# Patient Record
Sex: Female | Born: 1945 | Race: White | Hispanic: No | Marital: Married | Smoking: Never smoker
Health system: Southern US, Community
[De-identification: ages and names within clinical notes are randomized; demographics above are authoritative.]

## PROBLEM LIST (undated history)

## (undated) DIAGNOSIS — F419 Anxiety disorder, unspecified: Secondary | ICD-10-CM

## (undated) DIAGNOSIS — E785 Hyperlipidemia, unspecified: Secondary | ICD-10-CM

## (undated) DIAGNOSIS — F32A Depression, unspecified: Secondary | ICD-10-CM

## (undated) DIAGNOSIS — K219 Gastro-esophageal reflux disease without esophagitis: Secondary | ICD-10-CM

## (undated) DIAGNOSIS — I10 Essential (primary) hypertension: Secondary | ICD-10-CM

---

## 1981-10-24 HISTORY — PX: TUBAL LIGATION: SHX77

## 1983-10-25 HISTORY — PX: DILATION AND CURETTAGE OF UTERUS: SHX78

## 1988-10-24 HISTORY — PX: NASAL SINUS SURGERY: SHX719

## 2010-01-25 ENCOUNTER — Encounter: Admission: RE | Admit: 2010-01-25 | Discharge: 2010-01-25 | Payer: Self-pay | Admitting: Surgery

## 2010-09-27 ENCOUNTER — Encounter: Admission: RE | Admit: 2010-09-27 | Discharge: 2010-09-27 | Payer: Self-pay | Admitting: Surgery

## 2011-03-29 IMAGING — US US SOFT TISSUE HEAD/NECK
1 series · 14 of 25 positions shown · non-contrast
Comparison: None.

CLINICAL DATA: Thyroid nodules.

THYROID ULTRASOUND
TECHNIQUE: Ultrasound examination of the thyroid gland and
adjacent soft tissues was performed.

[Series 1: us soft tissue head/neck · 0.04mm/px · 14 of 46 slices shown]
[im 1/46]
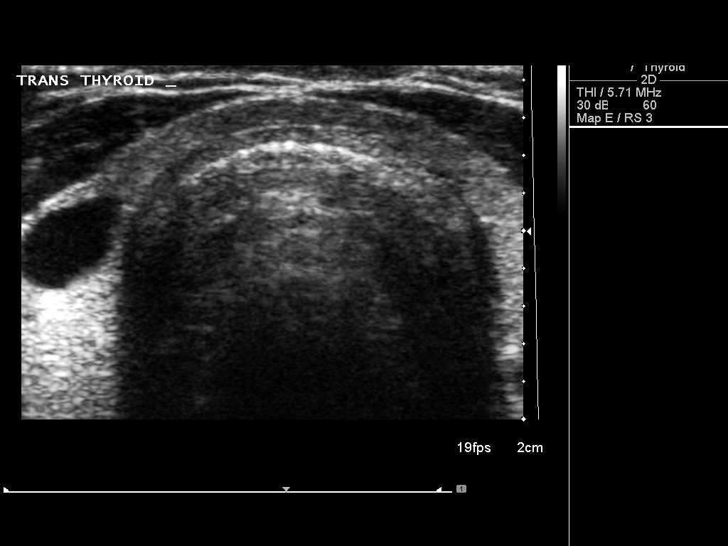
[im 4/46]
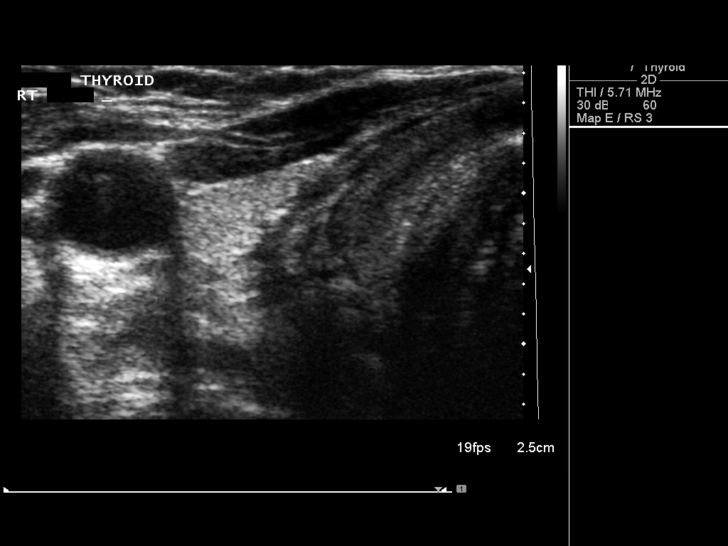
[im 8/46]
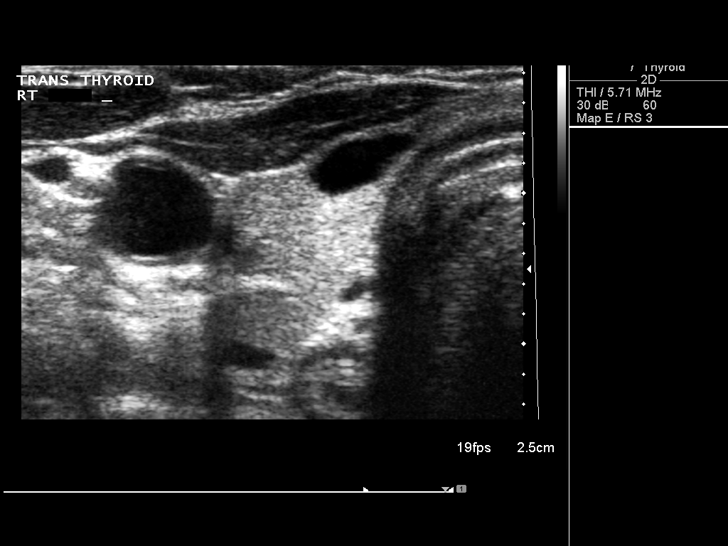
[im 12/46]
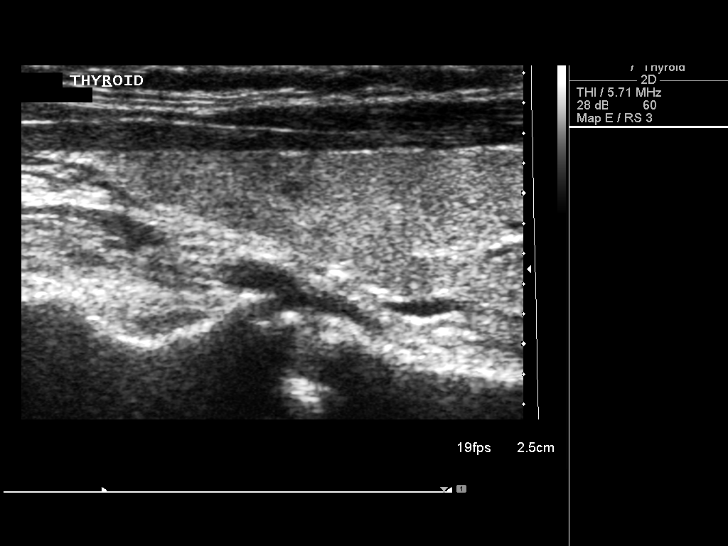
[im 16/46]
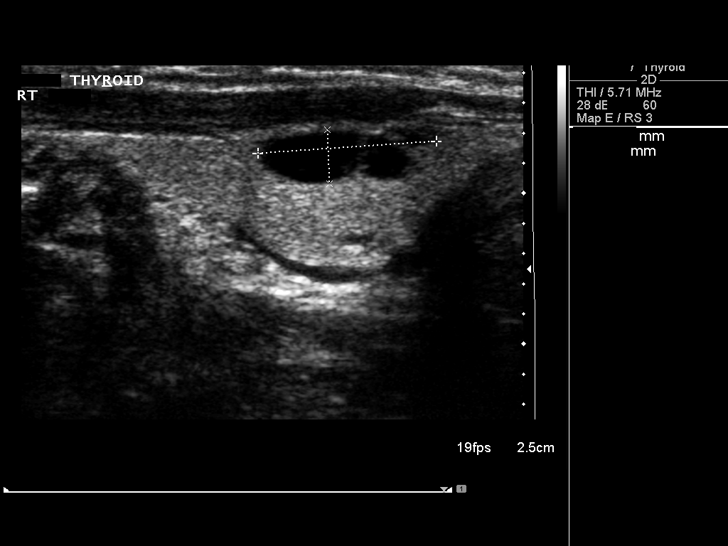
[im 17/46]
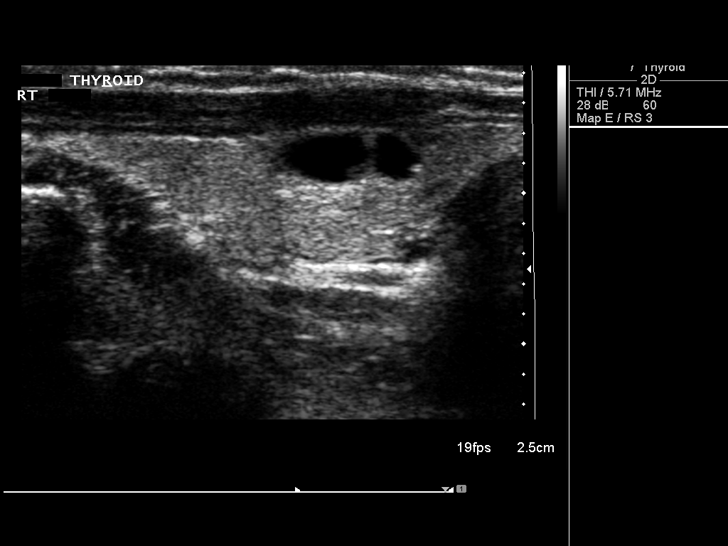
[im 21/46]
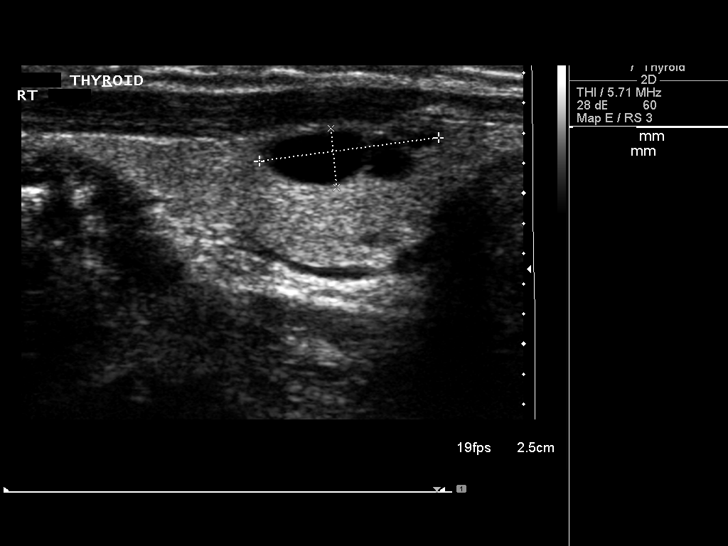
[im 25/46]
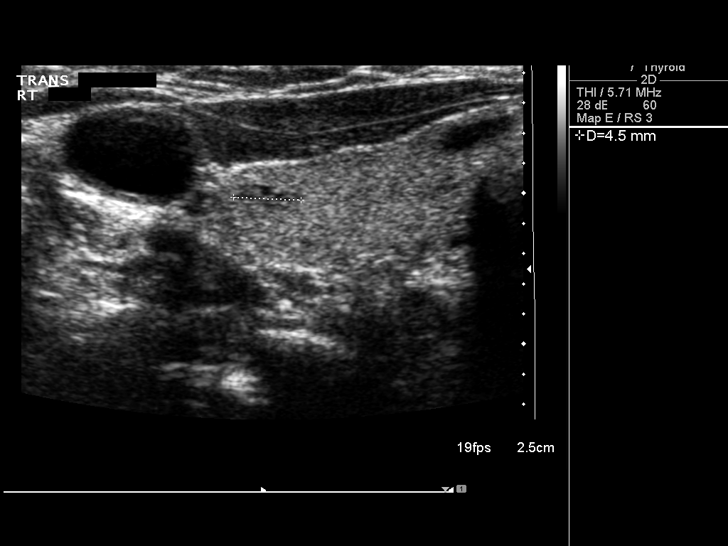
[im 29/46]
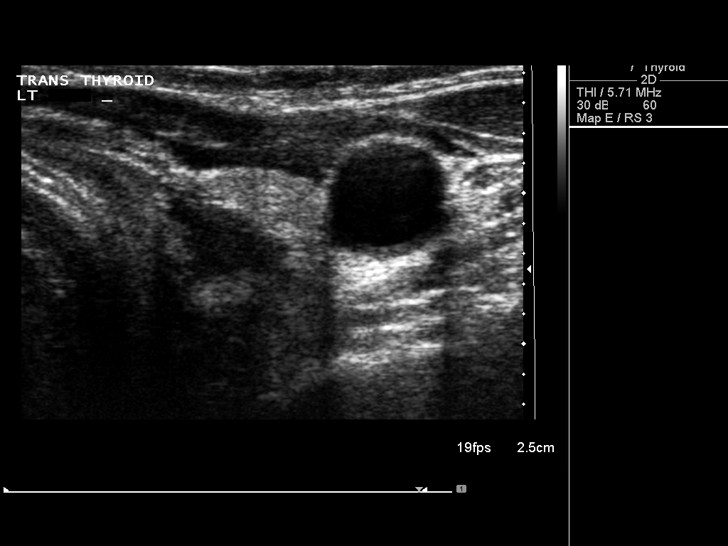
[im 31/46]
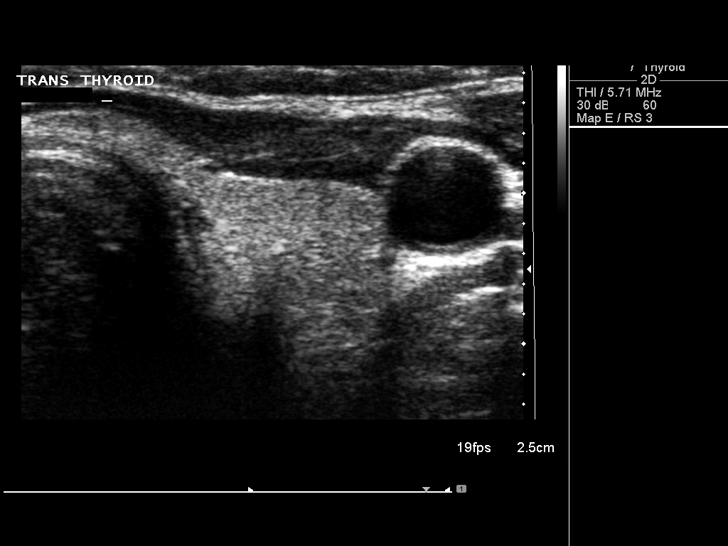
[im 34/46]
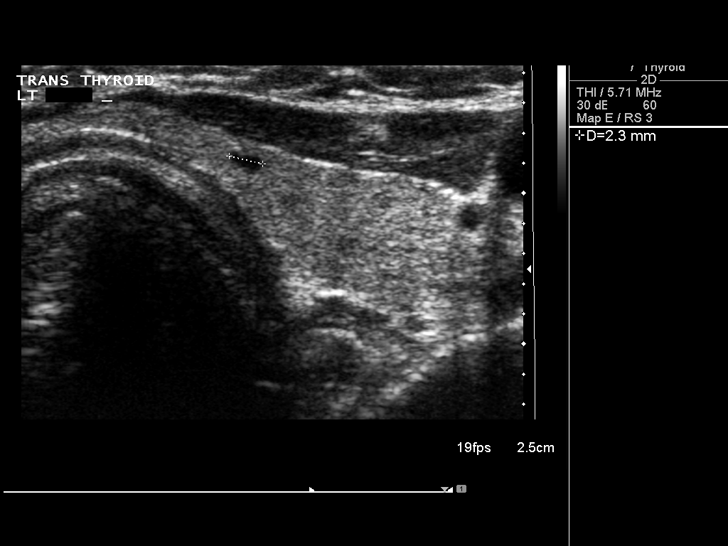
[im 38/46]
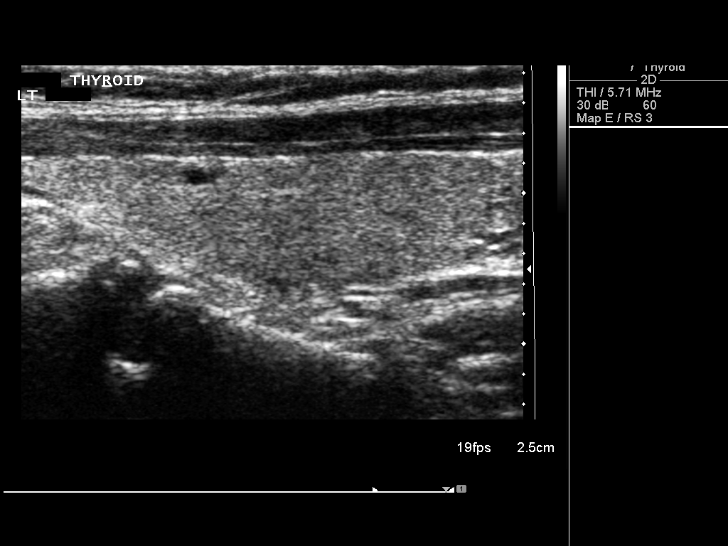
[im 42/46]
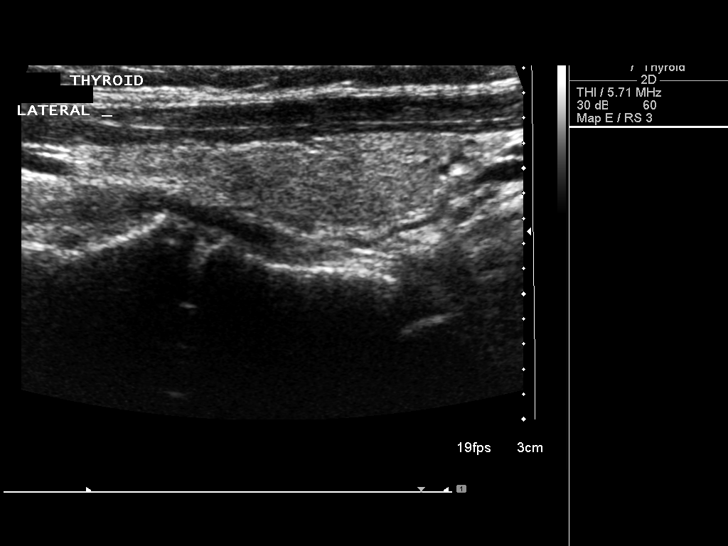
[im 46/46]
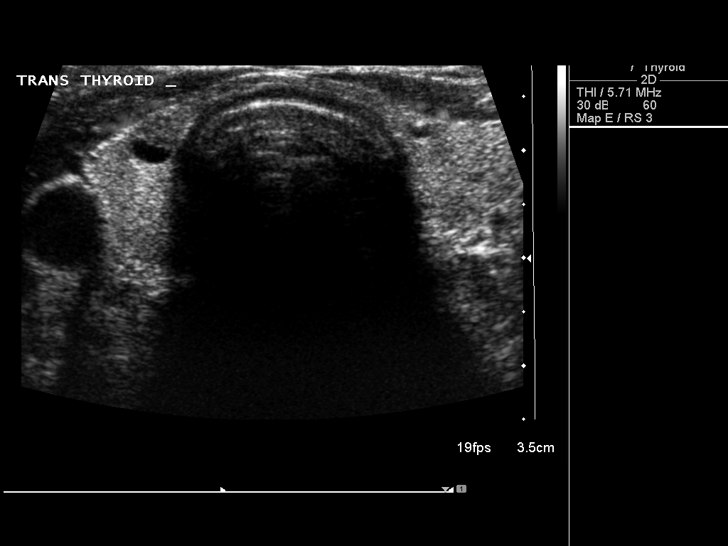

[14 of 25 positions shown; findings below may reference images not displayed]

FINDINGS: Right lobe of the thyroid measures 4.0 x 1.2 x 1.2 cm and
left lobe, 4.4 x 1.0 x 1.3 cm.  Isthmus measures 2 mm.  Thyroid
echotexture is homogeneous.  A bilobed cyst or a single cyst with
thin internal septation is seen in the medial right lower pole,
measuring 1.2 x 0.4 x 0.8 cm.  A solid nodule in the upper pole of
the right thyroid measures 4 mm.  There are scattered hypoechoic
nodules in the left thyroid, which measure 2 mm or less in size.
IMPRESSION: Bilobed or mildly complex right lower pole cyst.

## 2011-10-25 HISTORY — PX: POSTERIOR LUMBAR FUSION 4 LEVEL: SHX6037

## 2011-11-29 IMAGING — US US SOFT TISSUE HEAD/NECK
1 series · 14 of 25 positions shown · non-contrast
Comparison: 01/25/2010

CLINICAL DATA: Follow-up thyroid nodules

THYROID ULTRASOUND
TECHNIQUE: Ultrasound examination of the thyroid gland and adjacent
soft tissues was performed.

[Series 1: us soft tissue head/neck · 0.08mm/px · 14 of 60 slices shown]
[im 1/60]
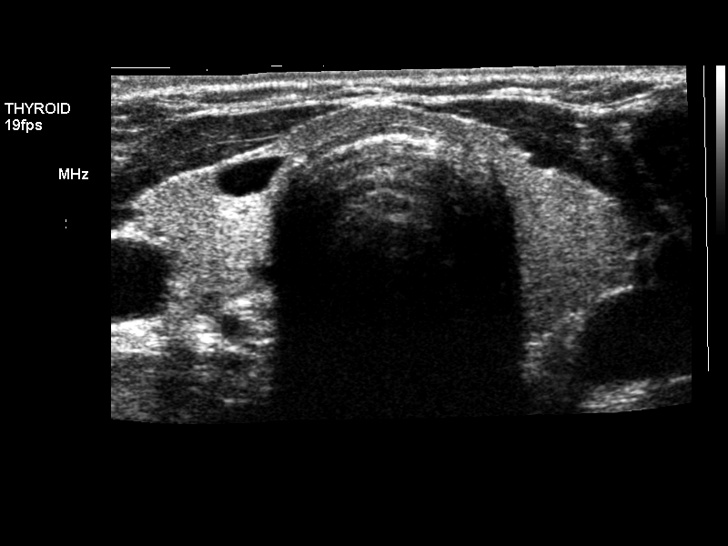
[im 5/60]
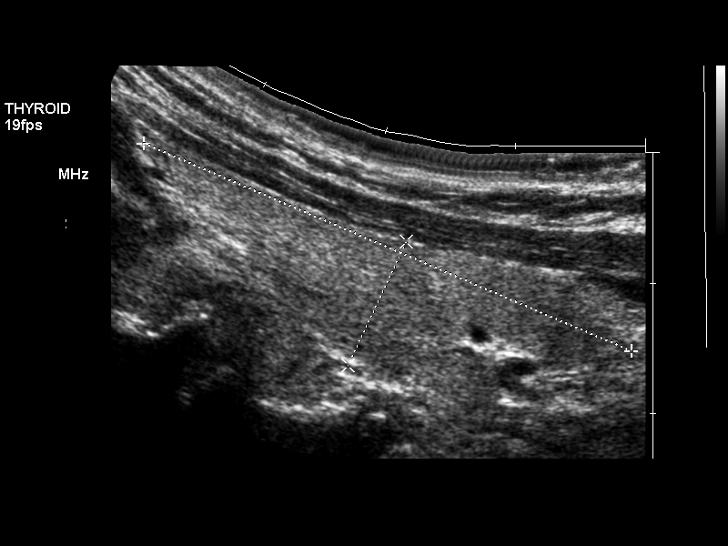
[im 10/60]
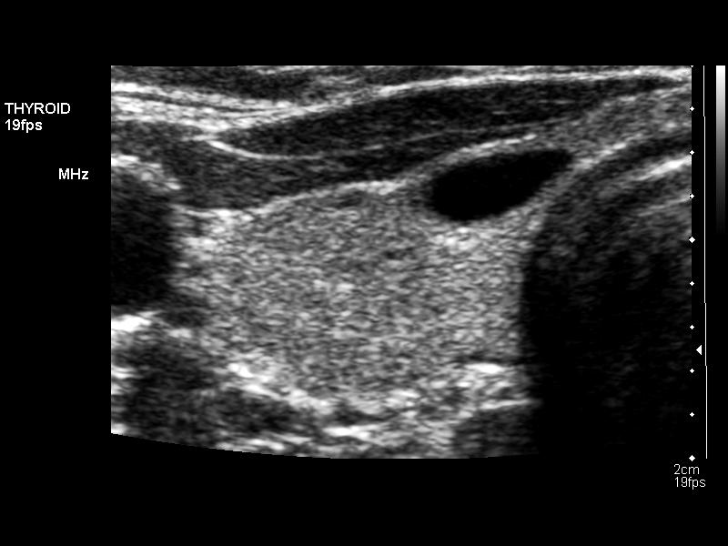
[im 15/60]
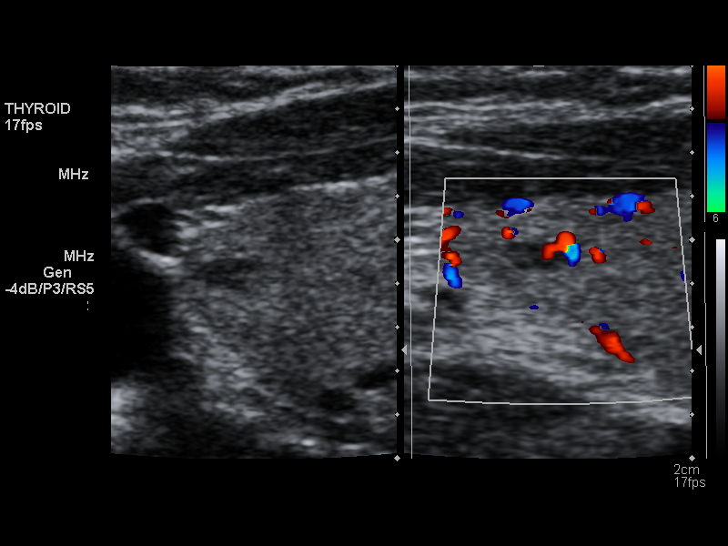
[im 20/60]
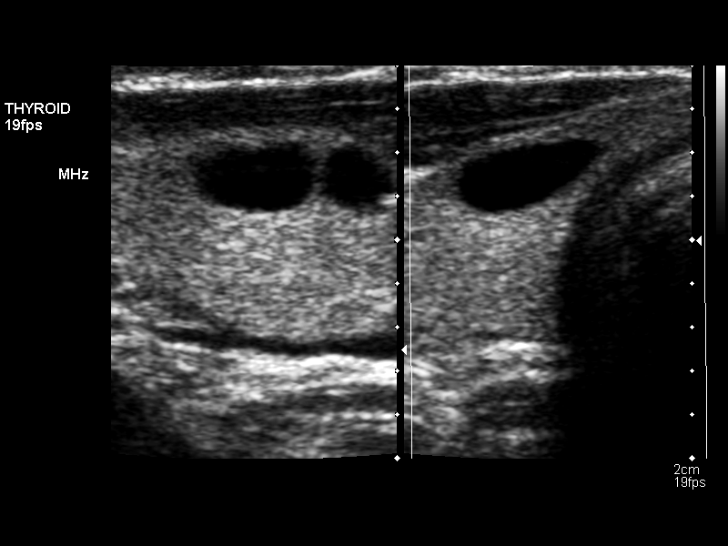
[im 23/60]
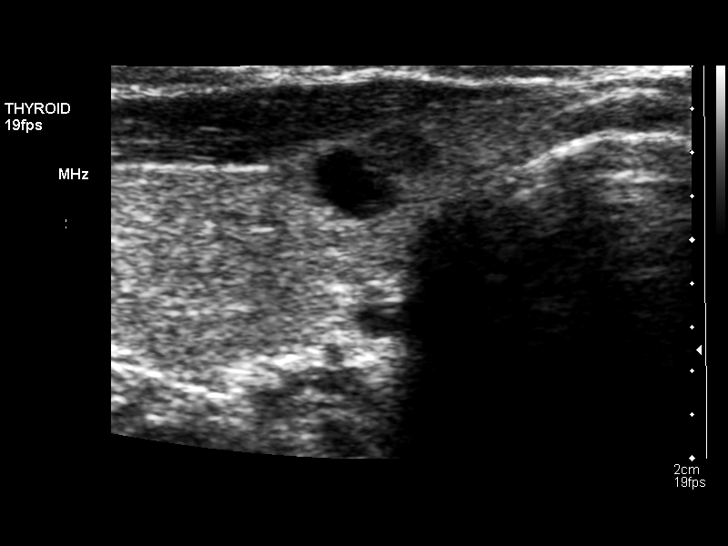
[im 28/60]
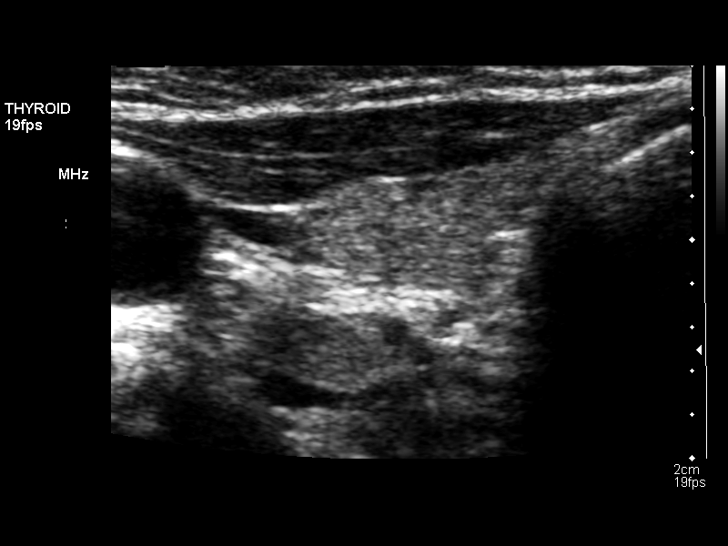
[im 32/60]
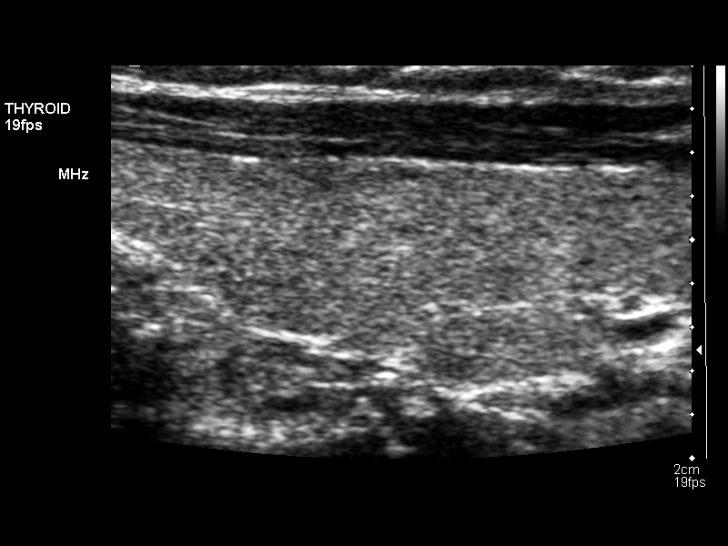
[im 37/60]
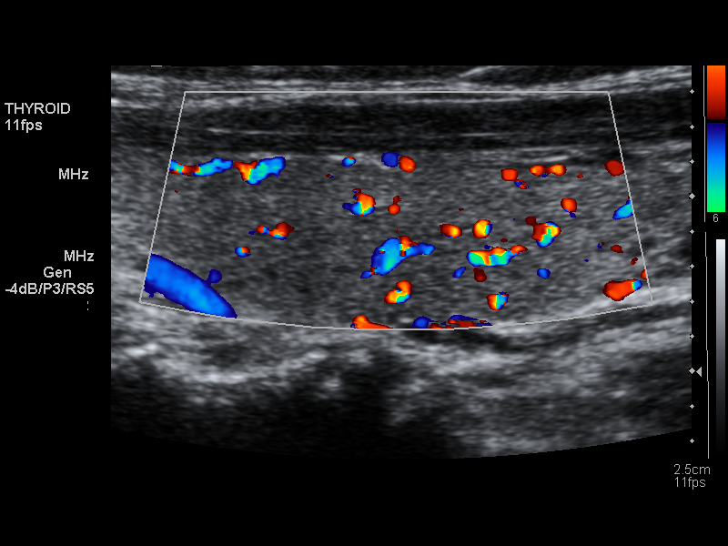
[im 40/60]
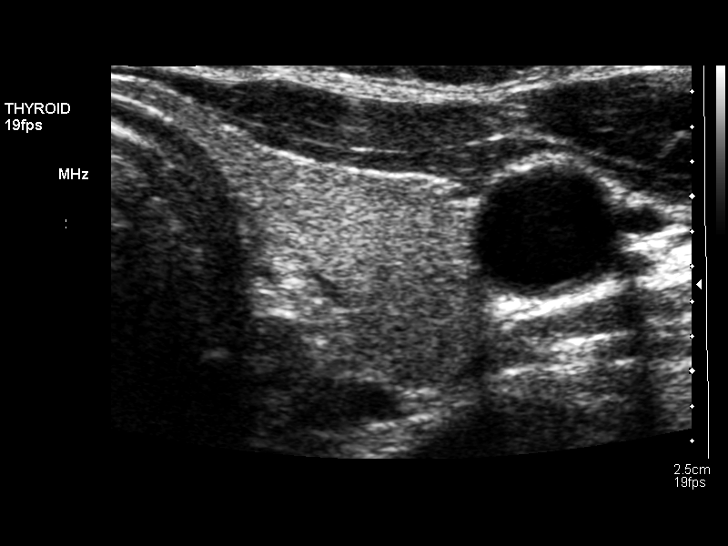
[im 45/60]
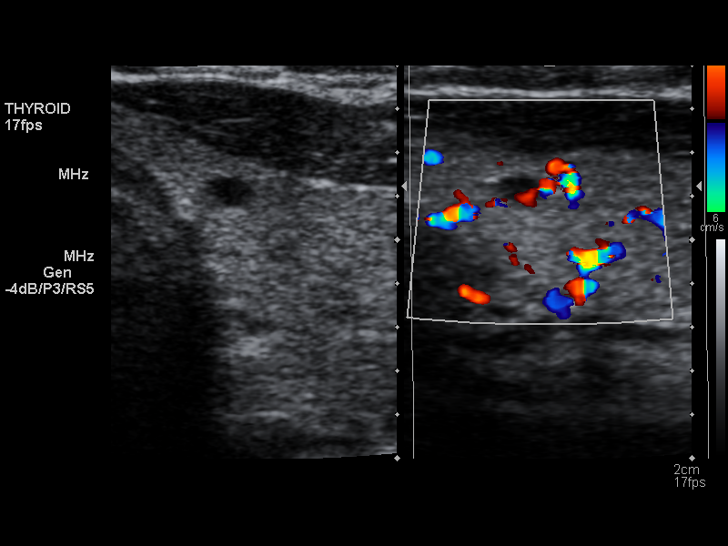
[im 50/60]
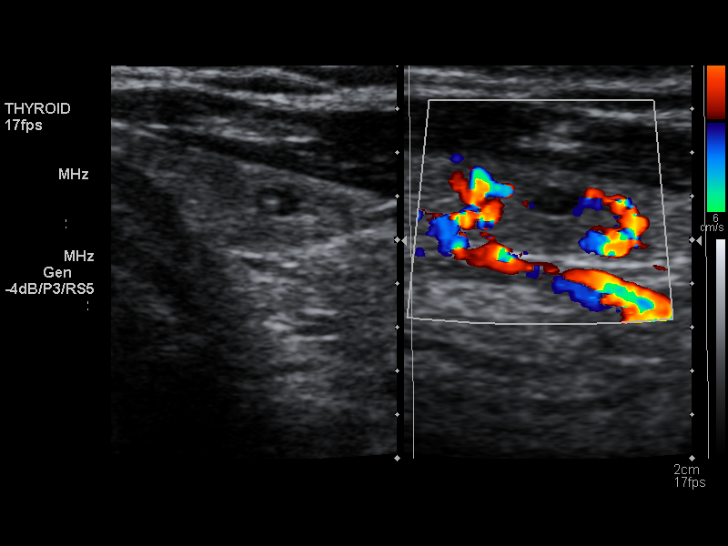
[im 55/60]
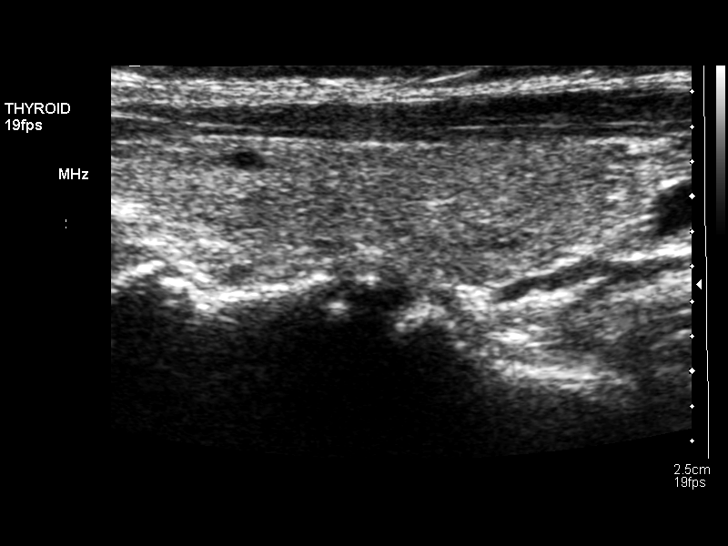
[im 60/60]
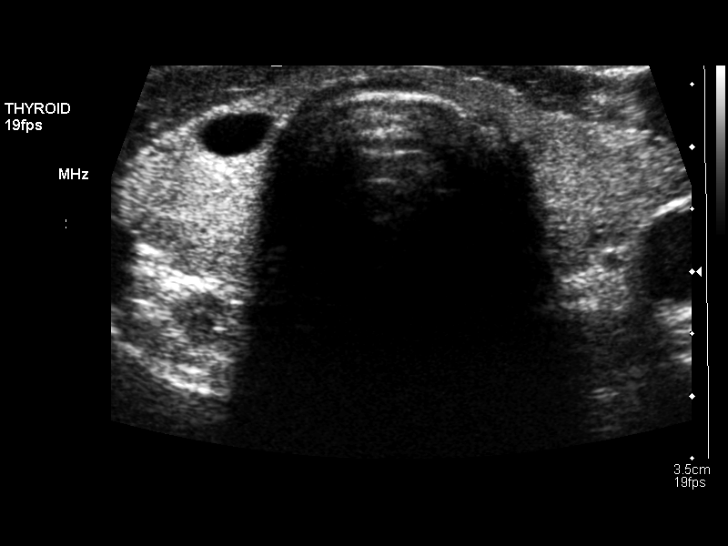

[14 of 25 positions shown; findings below may reference images not displayed]

FINDINGS: Right thyroid lobe:  4.1 x 1.1 x 1.3 cm
Left thyroid lobe:  3.6 x 0.8 x 1.3 cm
Isthmus:  2 mm thick

Focal nodules:  Multiple tiny bilateral thyroid nodules identified.
Largest solid nodule measures 6 mm in greatest size.  Septated
cystic nodule mid right thyroid lobe measures 10 mm in greatest
size, previously61 mm greatest size.  No dominant thyroid mass or
thyroid calcifications.

Lymphadenopathy:  None identified
IMPRESSION: Tiny nonspecific bilateral thyroid nodules and cysts as above.
No evidence of dominant or enlarging thyroid mass.
No significant interval change.

## 2013-05-20 ENCOUNTER — Other Ambulatory Visit: Payer: Self-pay | Admitting: Family Medicine

## 2013-05-20 DIAGNOSIS — N6320 Unspecified lump in the left breast, unspecified quadrant: Secondary | ICD-10-CM

## 2013-05-21 ENCOUNTER — Other Ambulatory Visit: Payer: Self-pay | Admitting: Family Medicine

## 2013-05-21 DIAGNOSIS — N6320 Unspecified lump in the left breast, unspecified quadrant: Secondary | ICD-10-CM

## 2013-06-04 ENCOUNTER — Other Ambulatory Visit: Payer: Self-pay

## 2013-06-05 ENCOUNTER — Ambulatory Visit
Admission: RE | Admit: 2013-06-05 | Discharge: 2013-06-05 | Disposition: A | Discharge status: Home / Self Care | Payer: Medicare Other | Source: Ambulatory Visit | Attending: Family Medicine | Admitting: Family Medicine

## 2013-06-05 DIAGNOSIS — N6320 Unspecified lump in the left breast, unspecified quadrant: Secondary | ICD-10-CM

## 2016-04-18 ENCOUNTER — Other Ambulatory Visit: Payer: Self-pay | Admitting: Family Medicine

## 2016-04-18 DIAGNOSIS — R2230 Localized swelling, mass and lump, unspecified upper limb: Secondary | ICD-10-CM

## 2016-04-18 DIAGNOSIS — R2232 Localized swelling, mass and lump, left upper limb: Secondary | ICD-10-CM

## 2016-04-18 DIAGNOSIS — M8580 Other specified disorders of bone density and structure, unspecified site: Secondary | ICD-10-CM

## 2016-05-03 ENCOUNTER — Ambulatory Visit
Admission: RE | Admit: 2016-05-03 | Discharge: 2016-05-03 | Disposition: A | Discharge status: Home / Self Care | Payer: Medicare Other | Source: Ambulatory Visit | Attending: Family Medicine | Admitting: Family Medicine

## 2016-05-03 ENCOUNTER — Other Ambulatory Visit: Payer: Self-pay | Admitting: Family Medicine

## 2016-05-03 DIAGNOSIS — M8580 Other specified disorders of bone density and structure, unspecified site: Secondary | ICD-10-CM

## 2016-05-03 DIAGNOSIS — R2232 Localized swelling, mass and lump, left upper limb: Secondary | ICD-10-CM

## 2017-03-16 ENCOUNTER — Ambulatory Visit (HOSPITAL_COMMUNITY): Payer: Medicare Other | Admitting: Psychiatry

## 2017-10-24 HISTORY — PX: HAMMER TOE SURGERY: SHX385

## 2018-09-09 ENCOUNTER — Encounter: Payer: Self-pay | Admitting: Emergency Medicine

## 2018-09-09 DIAGNOSIS — F411 Generalized anxiety disorder: Secondary | ICD-10-CM | POA: Insufficient documentation

## 2018-09-25 ENCOUNTER — Ambulatory Visit (INDEPENDENT_AMBULATORY_CARE_PROVIDER_SITE_OTHER): Payer: Medicare Other | Admitting: Psychiatry

## 2018-09-25 ENCOUNTER — Encounter: Payer: Self-pay | Admitting: Psychiatry

## 2018-09-25 DIAGNOSIS — F331 Major depressive disorder, recurrent, moderate: Secondary | ICD-10-CM | POA: Diagnosis not present

## 2018-09-25 DIAGNOSIS — F411 Generalized anxiety disorder: Secondary | ICD-10-CM | POA: Diagnosis not present

## 2018-09-25 MED ORDER — VENLAFAXINE HCL ER 37.5 MG PO CP24: 112.5000 mg | ORAL_CAPSULE | Freq: Every day | ORAL | 2 refills | Status: DC

## 2018-09-25 MED ORDER — CLONAZEPAM 0.5 MG PO TABS: 0.5000 mg | ORAL_TABLET | Freq: Two times a day (BID) | ORAL | 1 refills | Status: DC

## 2018-09-25 MED ORDER — TRAZODONE HCL 50 MG PO TABS: 50.0000 mg | ORAL_TABLET | Freq: Every day | ORAL | 3 refills | Status: DC

## 2018-09-25 NOTE — Progress Notes (Signed)
Catherine Haas [id number] 29-Sep-1946 72 y.o.  Subjective:   Patient ID:  Catherine Haas is a 72 y.o. (DOB 29-Sep-1946) female.  Chief Complaint:  Chief Complaint  Patient presents with  . Stress  . Anxiety    HPI Catherine Haas presents to the office today for follow-up of above.  Very stressed out.  Filed bankruptcy and angry with husband  That he won't challenge kids who are living off of her.  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  Kids live off them and won't contribute to the household.  Days of shaking all over with the stress. Tired and sleeps with trazodone.  Trying to just take the one clonazepam daily.  Feels the Effexor helps with everyday anxiety or stress.  Has run out and had problems.   Review of Systems:  Review of Systems  Neurological: Negative for tremors and weakness.  Psychiatric/Behavioral: Negative for agitation, behavioral problems, confusion, decreased concentration, dysphoric mood, hallucinations, self-injury, sleep disturbance and suicidal ideas. The patient is nervous/anxious. The patient is not hyperactive.     Medications: I have reviewed the patient's current medications.  Current Outpatient Medications  Medication Sig Dispense Refill  . atenolol (TENORMIN) 25 MG tablet Take 25 mg by mouth daily.    . clonazePAM (KLONOPIN) 0.5 MG tablet Take 0.5 mg by mouth 2 (two) times daily.    . meloxicam (MOBIC) 7.5 MG tablet Take 7.5 mg by mouth daily.    . traZODone (DESYREL) 50 MG tablet Take 50 mg by mouth at bedtime.    Marland Kitchen. venlafaxine XR (EFFEXOR-XR) 37.5 MG 24 hr capsule Take 37.5 mg by mouth. 3 qam     No current facility-administered medications for this visit.     Medication Side Effects: None  Allergies:  Allergies  Allergen Reactions  . Amoxicillin Nausea And Vomiting    No past medical history on file.  No family history on file.  Social History   Socioeconomic History  . Marital status: Married    Spouse name: Not  on file  . Number of children: Not on file  . Years of education: Not on file  . Highest education level: Not on file  Occupational History  . Not on file  Social Needs  . Financial resource strain: Not on file  . Food insecurity:    Worry: Not on file    Inability: Not on file  . Transportation needs:    Medical: Not on file    Non-medical: Not on file  Tobacco Use  . Smoking status: Not on file  Substance and Sexual Activity  . Alcohol use: Not on file  . Drug use: Not on file  . Sexual activity: Not on file  Lifestyle  . Physical activity:    Days per week: Not on file    Minutes per session: Not on file  . Stress: Not on file  Relationships  . Social connections:    Talks on phone: Not on file    Gets together: Not on file    Attends religious service: Not on file    Active member of club or organization: Not on file    Attends meetings of clubs or organizations: Not on file    Relationship status: Not on file  . Intimate partner violence:    Fear of current or ex partner: Not on file    Emotionally abused: Not on file    Physically abused: Not on file    Forced sexual activity: Not on  file  Other Topics Concern  . Not on file  Social History Narrative  . Not on file    Past Medical History, Surgical history, Social history, and Family history were reviewed and updated as appropriate.   Please see review of systems for further details on the patient's review from today.   Objective:   Physical Exam:  There were no vitals taken for this visit.  Physical Exam  Constitutional: She is oriented to person, place, and time. She appears well-developed. No distress.  Musculoskeletal: She exhibits no deformity.  Neurological: She is alert and oriented to person, place, and time. She displays no tremor. Coordination and gait normal.  Psychiatric: She has a normal mood and affect. Her speech is normal and behavior is normal. Judgment and thought content normal. Her  mood appears not anxious. Her affect is not angry, not blunt, not labile and not inappropriate. Cognition and memory are normal. She does not exhibit a depressed mood. She expresses no homicidal and no suicidal ideation. She expresses no suicidal plans and no homicidal plans.  Insightand judgment fair.  Boundary issues with son. No auditory or visual hallucinations. No delusions.  She is attentive.    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  Overall she feels the stress is better now.  Disc the ways to prevent this from happening again.  She feels the meds help and doesn't want a change.  It happened bc of using credit card bills and can't do so now.  We discussed the short-term risks associated with benzodiazepines including sedation and increased fall risk among others.  Discussed long-term side effect risk including dependence, potential withdrawal symptoms, and the potential eventual dose-related risk of dementia. She's using the lowest tolerated dose and is satisfied.  This appointment was 15 minutes  FU 9 mos  Meredith Staggers, MD, DFAPA   Please see After Visit Summary for patient specific instructions.  No future appointments.  No orders of the defined types were placed in this encounter.     -------------------------------

## 2018-09-26 ENCOUNTER — Other Ambulatory Visit: Payer: Self-pay

## 2018-09-26 MED ORDER — TRAZODONE HCL 50 MG PO TABS: 50.0000 mg | ORAL_TABLET | Freq: Every day | ORAL | 1 refills | Status: DC

## 2018-10-24 HISTORY — PX: CATARACT EXTRACTION W/ INTRAOCULAR LENS IMPLANTW/ TRABECULECTOMY: SHX1312

## 2019-02-09 ENCOUNTER — Other Ambulatory Visit: Payer: Self-pay | Admitting: Psychiatry

## 2019-02-18 ENCOUNTER — Other Ambulatory Visit: Payer: Self-pay

## 2019-02-18 MED ORDER — CLONAZEPAM 0.5 MG PO TABS: 0.5000 mg | ORAL_TABLET | Freq: Two times a day (BID) | ORAL | 1 refills | Status: DC

## 2019-04-15 ENCOUNTER — Other Ambulatory Visit: Payer: Self-pay

## 2019-04-15 MED ORDER — TRAZODONE HCL 50 MG PO TABS: 50.0000 mg | ORAL_TABLET | Freq: Every day | ORAL | 1 refills | Status: DC

## 2019-06-25 ENCOUNTER — Ambulatory Visit: Payer: Medicare Other | Admitting: Psychiatry

## 2019-07-02 ENCOUNTER — Ambulatory Visit: Payer: Medicare Other | Admitting: Psychiatry

## 2019-07-30 ENCOUNTER — Ambulatory Visit (INDEPENDENT_AMBULATORY_CARE_PROVIDER_SITE_OTHER): Payer: Medicare Other | Admitting: Psychiatry

## 2019-07-30 ENCOUNTER — Other Ambulatory Visit: Payer: Self-pay

## 2019-07-30 ENCOUNTER — Encounter: Payer: Self-pay | Admitting: Psychiatry

## 2019-07-30 DIAGNOSIS — F411 Generalized anxiety disorder: Secondary | ICD-10-CM | POA: Diagnosis not present

## 2019-07-30 DIAGNOSIS — F5105 Insomnia due to other mental disorder: Secondary | ICD-10-CM | POA: Diagnosis not present

## 2019-07-30 DIAGNOSIS — G3184 Mild cognitive impairment, so stated: Secondary | ICD-10-CM

## 2019-07-30 DIAGNOSIS — F331 Major depressive disorder, recurrent, moderate: Secondary | ICD-10-CM

## 2019-07-30 DIAGNOSIS — R7989 Other specified abnormal findings of blood chemistry: Secondary | ICD-10-CM

## 2019-07-30 NOTE — Progress Notes (Signed)
Catherine Haas [id number] Apr 05, 1946 73 y.o.  Subjective:   Patient ID:  Catherine Haas is a 73 y.o. (DOB Apr 05, 1946) female.  Chief Complaint:  Chief Complaint  Patient presents with  . Follow-up    Anxiety and depression and med management  . Anxiety    HPI Catherine Haas presents to the office today for follow-up of above.   Last seen 9 mo ago.  Double foot surgery.  Goal getting back to weight room.  [place] and [place].  He's Forgetful and been on meds since 2nd grade.  He needs psych care.  Has had his appt cancelled.  Disc her concerns over getting him care bc of provider illness.  Disc ways to transfer his care.   His disability has reduced her stressor.  Covid has left her feeling trapped and bored.  Gkids have been home for 6 mos.  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   Very stressed out.  Filed bankruptcy and angry with husband  That he won't challenge kids who are living off of her.   Kids live off them and won't contribute to the household.  Days of shaking all over with the stress. Tired and sleeps with trazodone.  Trying to just take the one clonazepam daily.  Chronic stress but not depressed.  Complained of more difficulty with attention and concentration and short-term memory.  Losing things.  Harder to keep track of schedule.  She wants a work-up of her memory.  Feels the Effexor helps with everyday anxiety or stress.  Has run out and had problems.   Review of Systems:  Review of Systems  Musculoskeletal: Positive for arthralgias.  Neurological: Negative for tremors and weakness.  Psychiatric/Behavioral: Negative for agitation, behavioral problems, confusion, decreased concentration, dysphoric mood, hallucinations, self-injury, sleep disturbance and suicidal ideas. The patient is nervous/anxious. The patient is not hyperactive.     Medications: I have reviewed the patient's current medications.  Current Outpatient Medications  Medication  Sig Dispense Refill  . atenolol (TENORMIN) 25 MG tablet Take 25 mg by mouth daily.    . clonazePAM (KLONOPIN) 0.5 MG tablet Take 1 tablet (0.5 mg total) by mouth 2 (two) times daily. 180 tablet 1  . meloxicam (MOBIC) 7.5 MG tablet Take 7.5 mg by mouth daily.    . traZODone (DESYREL) 50 MG tablet Take 1 tablet (50 mg total) by mouth at bedtime. 90 tablet 1  . venlafaxine XR (EFFEXOR-XR) 37.5 MG 24 hr capsule TAKE 3 CAPSULES BY MOUTH  EVERY MORNING 270 capsule 2   No current facility-administered medications for this visit.     Medication Side Effects: None  Allergies:  Allergies  Allergen Reactions  . Amoxicillin Nausea And Vomiting    History reviewed. No pertinent past medical history.  History reviewed. No pertinent family history.  Social History   Socioeconomic History  . Marital status: Married    Spouse name: Not on file  . Number of children: Not on file  . Years of education: Not on file  . Highest education level: Not on file  Occupational History  . Not on file  Social Needs  . Financial resource strain: Not on file  . Food insecurity    Worry: Not on file    Inability: Not on file  . Transportation needs    Medical: Not on file    Non-medical: Not on file  Tobacco Use  . Smoking status: Never Smoker  . Smokeless tobacco: Never Used  Substance and  Sexual Activity  . Alcohol use: Not on file  . Drug use: Not on file  . Sexual activity: Not on file  Lifestyle  . Physical activity    Days per week: Not on file    Minutes per session: Not on file  . Stress: Not on file  Relationships  . Social Musician on phone: Not on file    Gets together: Not on file    Attends religious service: Not on file    Active member of club or organization: Not on file    Attends meetings of clubs or organizations: Not on file    Relationship status: Not on file  . Intimate partner violence    Fear of current or ex partner: Not on file    Emotionally abused:  Not on file    Physically abused: Not on file    Forced sexual activity: Not on file  Other Topics Concern  . Not on file  Social History Narrative  . Not on file    Past Medical History, Surgical history, Social history, and Family history were reviewed and updated as appropriate.   Please see review of systems for further details on the patient's review from today.   Objective:   Physical Exam:  There were no vitals taken for this visit.  Physical Exam Constitutional:      General: She is not in acute distress.    Appearance: She is well-developed.  Musculoskeletal:        General: No deformity.  Neurological:     Mental Status: She is alert and oriented to person, place, and time.     Motor: No tremor.     Coordination: Coordination normal.     Gait: Gait normal.  Psychiatric:        Attention and Perception: She is attentive.        Mood and Affect: Mood is not anxious or depressed. Affect is not labile, blunt, angry or inappropriate.        Speech: Speech normal.        Behavior: Behavior normal.        Thought Content: Thought content normal. Thought content does not include homicidal or suicidal ideation. Thought content does not include homicidal or suicidal plan.        Cognition and Memory: Cognition normal.        Judgment: Judgment normal.     Comments: Insightand judgment fair.  Boundary issues with son. No auditory or visual hallucinations. No delusions.      Lab Review:  No results found for: NA, K, CL, CO2, GLUCOSE, BUN, CREATININE, CALCIUM, PROT, ALBUMIN, AST, ALT, ALKPHOS, BILITOT, GFRNONAA, GFRAA  No results found for: WBC, RBC, HGB, HCT, PLT, MCV, MCH, MCHC, RDW, LYMPHSABS, MONOABS, EOSABS, BASOSABS  No results found for: POCLITH, LITHIUM   No results found for: PHENYTOIN, PHENOBARB, VALPROATE, CBMZ   .res Assessment: Plan:    Mild cognitive impairment - Plan: CBC, Comprehensive metabolic panel, TSH, Vitamin D 1,25 dihydroxy, B12 and Folate  Panel  Generalized anxiety disorder  Major depressive disorder, recurrent episode, moderate (HCC)  Insomnia due to mental condition  Low vitamin D level - Plan: Vitamin D 1,25 dihydroxy   Greater than 50% of 30 min face to face time with patient was spent on counseling and coordination of care. 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  Overall she feels the stress is better now.  Son's disability has helped.  Disc the ways to prevent this from happening again.  She feels the meds help and doesn't want a change.  It happened bc of using credit card bills and can't do so now.  We discussed the short-term risks associated with benzodiazepines including sedation and increased fall risk among others.  Discussed long-term side effect risk including dependence, potential withdrawal symptoms, and the potential eventual dose-related risk of dementia. She's using the lowest tolerated dose and is satisfied. Sometimes can do without it.  She complains of short-term memory problems that are beginning to interfere with her function.  Disc workup for memory problems.  Will schedule earlier appt and perform MMSE and other evaluation for memory and order labs. Disc these labs and their purpose.  FU 8 weeks  Lynder Parents, MD, DFAPA   Please see After Visit Summary for patient specific instructions.  No future appointments.  Orders Placed This Encounter  Procedures  . CBC  . Comprehensive metabolic panel  . TSH  . Vitamin D 1,25 dihydroxy  . B12 and Folate Panel      -------------------------------

## 2019-09-09 LAB — TSH: TSH: 1.76 u[IU]/mL (ref 0.450–4.500)

## 2019-09-09 LAB — CBC
Hematocrit: 40.1 % (ref 34.0–46.6)
Hemoglobin: 13.7 g/dL (ref 11.1–15.9)
MCH: 32.2 pg (ref 26.6–33.0)
MCHC: 34.2 g/dL (ref 31.5–35.7)
MCV: 94 fL (ref 79–97)
Platelets: 213 10*3/uL (ref 150–450)
RBC: 4.25 x10E6/uL (ref 3.77–5.28)
RDW: 12.4 % (ref 11.7–15.4)
WBC: 5.3 10*3/uL (ref 3.4–10.8)

## 2019-09-09 LAB — COMPREHENSIVE METABOLIC PANEL
ALT: 13 IU/L (ref 0–32)
AST: 22 IU/L (ref 0–40)
Albumin/Globulin Ratio: 1.8 (ref 1.2–2.2)
Albumin: 4.3 g/dL (ref 3.7–4.7)
Alkaline Phosphatase: 86 IU/L (ref 39–117)
BUN/Creatinine Ratio: 14 (ref 12–28)
BUN: 14 mg/dL (ref 8–27)
Bilirubin Total: 0.5 mg/dL (ref 0.0–1.2)
CO2: 25 mmol/L (ref 20–29)
Calcium: 9.5 mg/dL (ref 8.7–10.3)
Chloride: 104 mmol/L (ref 96–106)
Creatinine, Ser: 0.97 mg/dL (ref 0.57–1.00)
GFR calc Af Amer: 67 mL/min/{1.73_m2} (ref >59)
GFR calc non Af Amer: 58 mL/min/{1.73_m2} — ABNORMAL LOW (ref >59)
Globulin, Total: 2.4 g/dL (ref 1.5–4.5)
Glucose: 93 mg/dL (ref 65–99)
Potassium: 4.3 mmol/L (ref 3.5–5.2)
Sodium: 142 mmol/L (ref 134–144)
Total Protein: 6.7 g/dL (ref 6.0–8.5)

## 2019-09-09 LAB — VITAMIN D 1,25 DIHYDROXY
Vitamin D 1, 25 (OH)2 Total: 64 pg/mL
Vitamin D2 1, 25 (OH)2: <10 pg/mL
Vitamin D3 1, 25 (OH)2: 64 pg/mL

## 2019-09-09 LAB — B12 AND FOLATE PANEL
Folate: 11.1 ng/mL (ref >3.0)
Vitamin B-12: 142 pg/mL — ABNORMAL LOW (ref 232–1245)

## 2019-09-27 ENCOUNTER — Other Ambulatory Visit: Payer: Self-pay | Admitting: Psychiatry

## 2019-09-28 ENCOUNTER — Other Ambulatory Visit: Payer: Self-pay | Admitting: Psychiatry

## 2019-10-02 ENCOUNTER — Ambulatory Visit (INDEPENDENT_AMBULATORY_CARE_PROVIDER_SITE_OTHER): Payer: Medicare Other | Admitting: Psychiatry

## 2019-10-02 ENCOUNTER — Encounter: Payer: Self-pay | Admitting: Psychiatry

## 2019-10-02 ENCOUNTER — Encounter

## 2019-10-02 ENCOUNTER — Other Ambulatory Visit: Payer: Self-pay

## 2019-10-02 DIAGNOSIS — E538 Deficiency of other specified B group vitamins: Secondary | ICD-10-CM | POA: Diagnosis not present

## 2019-10-02 DIAGNOSIS — F331 Major depressive disorder, recurrent, moderate: Secondary | ICD-10-CM

## 2019-10-02 DIAGNOSIS — G3184 Mild cognitive impairment, so stated: Secondary | ICD-10-CM | POA: Diagnosis not present

## 2019-10-02 DIAGNOSIS — F411 Generalized anxiety disorder: Secondary | ICD-10-CM | POA: Diagnosis not present

## 2019-10-02 DIAGNOSIS — F5105 Insomnia due to other mental disorder: Secondary | ICD-10-CM

## 2019-10-02 MED ORDER — CYANOCOBALAMIN 2000 MCG PO TABS: 2000.0000 ug | ORAL_TABLET | Freq: Every day | ORAL | 0 refills | Status: DC

## 2019-10-02 NOTE — Progress Notes (Signed)
Catherine Haas [id number] 04-14-1946 73 y.o.  Subjective:   Patient ID:  Catherine Haas is a 73 y.o. (DOB 1946-08-25) female.  Chief Complaint:  Chief Complaint  Patient presents with  . Follow-up     Medication Management  . Anxiety     Medication Management  . Memory Loss    Anxiety Symptoms include nervous/anxious behavior. Patient reports no confusion, decreased concentration or suicidal ideas.     Catherine Haas presents to the office today for follow-up of above.   Last seen October 2020.  She had complaints about her memory.  Laboratory tests were discussed and she was scheduled for an earlier appointment than usual to perform Mini-Mental state exam.  There were no med change at the last visit.  She remains under chronic stress including financial stress caused by having to take care of their son and his wife.  Recently filed bankruptcy.  Double foot surgery.  Goal getting back to weight room.  Pleased Catherine Haas got disability and that helps.  But his wife is taking his money for disability and putting it in her account.  Catherine Haas won't stand up to her stealing bc afraid she'll take the kids from him.  He's Forgetful and been on meds since 2nd grade.  He needs psych care.  Has had his appt cancelled.  Disc her concerns over getting him care bc of provider illness.  Disc ways to transfer his care.   His disability has reduced her stressor.  Covid has left her feeling trapped and bored.  Gkids have been home for 6 mos.  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   Very stressed out.  Filed bankruptcy and angry with husband  That he won't challenge kids who are living off of her.   Kids live off them and won't contribute to the household.  Days of shaking all over with the stress. Tired and sleeps with trazodone.  Trying to just take the one clonazepam daily.  Chronic stress but not depressed.  Complained of more difficulty with attention and concentration and short-term memory.  Losing things.   Harder to keep track of schedule.  She wants a work-up of her memory.  Feels the Effexor helps with everyday anxiety or stress.  Has run out and had problems.  Past Psychiatric Medication Trials:    Review of Systems:  Review of Systems  Musculoskeletal: Positive for arthralgias.  Neurological: Negative for tremors, weakness and numbness.  Psychiatric/Behavioral: Negative for agitation, behavioral problems, confusion, decreased concentration, dysphoric mood, hallucinations, self-injury, sleep disturbance and suicidal ideas. The patient is nervous/anxious. The patient is not hyperactive.     Medications: I have reviewed the patient's current medications.  Current Outpatient Medications  Medication Sig Dispense Refill  . alendronate (FOSAMAX) 70 MG tablet Take 70 mg by mouth once a week.    Marland Kitchen amoxicillin (AMOXIL) 500 MG capsule TAKE 4 CAPSULES 1 HOUR BEFORE DENTAL TREATMENT    . ascorbic acid (VITAMIN C) 1000 MG tablet Take by mouth.    Marland Kitchen atenolol (TENORMIN) 25 MG tablet Take 25 mg by mouth daily.    . calcium carbonate (CALCIUM 600) 1500 (600 Ca) MG TABS tablet Take by mouth 2 (two) times daily with a meal.    . calcium citrate-vitamin D (CITRACAL+D) 315-200 MG-UNIT tablet Take 1 tablet by mouth 2 (two) times daily.    . clonazePAM (KLONOPIN) 0.5 MG tablet Take 1 tablet (0.5 mg total) by mouth 2 (two) times daily. 180 tablet 1  . gabapentin (NEURONTIN)  100 MG capsule Take by mouth.    . losartan (COZAAR) 25 MG tablet Take 25 mg by mouth daily.    . traZODone (DESYREL) 50 MG tablet TAKE 1 TABLET BY MOUTH AT  BEDTIME 90 tablet 3  . venlafaxine XR (EFFEXOR-XR) 37.5 MG 24 hr capsule TAKE 3 CAPSULES BY MOUTH IN THE MORNING 270 capsule 3  . cyanocobalamin ([facility] VITAMIN B12) 2000 MCG tablet Take 1 tablet (2,000 mcg total) by mouth daily. 90 tablet 0   No current facility-administered medications for this visit.     Medication Side Effects: None  Allergies:  Allergies  Allergen Reactions   . Codeine Nausea And Vomiting  . Amoxicillin Nausea And Vomiting    History reviewed. No pertinent past medical history.  History reviewed. No pertinent family history.  Social History   Socioeconomic History  . Marital status: Married    Spouse name: Not on file  . Number of children: Not on file  . Years of education: Not on file  . Highest education level: Not on file  Occupational History  . Not on file  Social Needs  . Financial resource strain: Not on file  . Food insecurity    Worry: Not on file    Inability: Not on file  . Transportation needs    Medical: Not on file    Non-medical: Not on file  Tobacco Use  . Smoking status: Never Smoker  . Smokeless tobacco: Never Used  Substance and Sexual Activity  . Alcohol use: Not on file  . Drug use: Not on file  . Sexual activity: Not on file  Lifestyle  . Physical activity    Days per week: Not on file    Minutes per session: Not on file  . Stress: Not on file  Relationships  . Social Musicianconnections    Talks on phone: Not on file    Gets together: Not on file    Attends religious service: Not on file    Active member of club or organization: Not on file    Attends meetings of clubs or organizations: Not on file    Relationship status: Not on file  . Intimate partner violence    Fear of current or ex partner: Not on file    Emotionally abused: Not on file    Physically abused: Not on file    Forced sexual activity: Not on file  Other Topics Concern  . Not on file  Social History Narrative  . Not on file    Past Medical History, Surgical history, Social history, and Family history were reviewed and updated as appropriate.   Please see review of systems for further details on the patient's review from today.   Objective:   Physical Exam:  There were no vitals taken for this visit.  Physical Exam Constitutional:      General: She is not in acute distress.    Appearance: She is well-developed.   Musculoskeletal:        General: No deformity.  Neurological:     Mental Status: She is alert and oriented to person, place, and time.     Motor: No tremor.     Coordination: Coordination normal.     Gait: Gait normal.  Psychiatric:        Attention and Perception: She is attentive.        Mood and Affect: Mood is not anxious or depressed. Affect is not labile, blunt, angry or inappropriate.  Speech: Speech normal.        Behavior: Behavior normal.        Thought Content: Thought content normal. Thought content does not include homicidal or suicidal ideation. Thought content does not include homicidal or suicidal plan.        Cognition and Memory: Cognition normal.        Judgment: Judgment normal.     Comments: Insightand judgment fair.  Boundary issues with son. No auditory or visual hallucinations. No delusions.     MMSE = 30/30 10/02/2019  Lab Review:     Component Value Date/Time   NA 142 09/03/2019 0815   K 4.3 09/03/2019 0815   CL 104 09/03/2019 0815   CO2 25 09/03/2019 0815   GLUCOSE 93 09/03/2019 0815   BUN 14 09/03/2019 0815   CREATININE 0.97 09/03/2019 0815   CALCIUM 9.5 09/03/2019 0815   PROT 6.7 09/03/2019 0815   ALBUMIN 4.3 09/03/2019 0815   AST 22 09/03/2019 0815   ALT 13 09/03/2019 0815   ALKPHOS 86 09/03/2019 0815   BILITOT 0.5 09/03/2019 0815   GFRNONAA 58 (L) 09/03/2019 0815   GFRAA 67 09/03/2019 0815       Component Value Date/Time   WBC 5.3 09/03/2019 0815   RBC 4.25 09/03/2019 0815   HGB 13.7 09/03/2019 0815   HCT 40.1 09/03/2019 0815   PLT 213 09/03/2019 0815   MCV 94 09/03/2019 0815   MCH 32.2 09/03/2019 0815   MCHC 34.2 09/03/2019 0815   RDW 12.4 09/03/2019 0815    No results found for: POCLITH, LITHIUM   No results found for: PHENYTOIN, PHENOBARB, VALPROATE, CBMZ   .res Assessment: Plan:    Low serum vitamin B12 - Plan: Vitamin B12  Mild cognitive impairment - Plan: Vitamin B12  Generalized anxiety disorder  Major  depressive disorder, recurrent episode, moderate (HCC)  Insomnia due to mental condition   Greater than 50% of 30 min face to face time with patient was spent on counseling and coordination of care. 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  Overall she feels the stress is better now.  Son's disability has helped.  Disc the ways to prevent this from happening again.  She feels the meds help and doesn't want a change.  It happened bc of using credit card bills and can't do so now.  We discussed the short-term risks associated with benzodiazepines including sedation and increased fall risk among others.  Discussed long-term side effect risk including dependence, potential withdrawal symptoms, and the potential eventual dose-related risk of dementia. She's using the lowest tolerated dose and is satisfied. Sometimes can do without it.  She complains of short-term memory problems that are beginning to interfere with her function.  Disc workup for memory problems.   Comprehensive metabolic panel was within normal limits. Normal folate level B12 level was low at 142 Vitamin D 64 Normal CBC Normal TSH  Trial OTC B12 2000 mcg daily.    Normal clock drawing  Start po B12 and Repeat level in 4-6 weeks.  Disc results MMSE.  Not suggestive of dementia.  Only abnormality is counting backwards was impaired.  FU 3 mos  Lynder Parents, MD, DFAPA   Please see After Visit Summary for patient specific instructions.  Start B12 tablet daily  After January 25, go to [facility] and get repeat B12 level.  Future Appointments  Date Time Provider [place]  12/31/2019  1:00 PM Cottle, Hiram Comber  Threasa Alpha., MD [facility] None    Orders Placed This Encounter  Procedures  . Vitamin B12      -------------------------------

## 2019-10-02 NOTE — Patient Instructions (Signed)
Start B12 tablet daily  After January 25, go to [facility] and get repeat B12 level.

## 2019-11-19 ENCOUNTER — Other Ambulatory Visit: Payer: Self-pay

## 2019-11-19 ENCOUNTER — Telehealth: Payer: Self-pay | Admitting: Psychiatry

## 2019-11-19 MED ORDER — CLONAZEPAM 0.5 MG PO TABS: 0.5000 mg | ORAL_TABLET | Freq: Two times a day (BID) | ORAL | 1 refills | Status: DC

## 2019-11-19 NOTE — Telephone Encounter (Signed)
Please give her a call at [phone]

## 2019-11-19 NOTE — Telephone Encounter (Signed)
Patient called and said that she has had a lot going on . Her dog died her father in law is dying and her son ad daughter in law are going through a divorce. She has had the great grandchildren for three weeks. So she has taken more of her klonopin. She is taking two to three pills a day and only has ten left. She would like more to be sent in to [facility] on [address] in [place].

## 2019-11-19 NOTE — Telephone Encounter (Signed)
Prescription sent with refill

## 2019-12-02 ENCOUNTER — Other Ambulatory Visit: Payer: Self-pay | Admitting: Psychiatry

## 2019-12-14 LAB — VITAMIN B12: Vitamin B-12: 1280 pg/mL — ABNORMAL HIGH (ref 232–1245)

## 2019-12-31 ENCOUNTER — Encounter: Payer: Self-pay | Admitting: Psychiatry

## 2019-12-31 ENCOUNTER — Other Ambulatory Visit: Payer: Self-pay

## 2019-12-31 ENCOUNTER — Ambulatory Visit (INDEPENDENT_AMBULATORY_CARE_PROVIDER_SITE_OTHER): Payer: Medicare Other | Admitting: Psychiatry

## 2019-12-31 DIAGNOSIS — F331 Major depressive disorder, recurrent, moderate: Secondary | ICD-10-CM | POA: Diagnosis not present

## 2019-12-31 DIAGNOSIS — F411 Generalized anxiety disorder: Secondary | ICD-10-CM | POA: Diagnosis not present

## 2019-12-31 DIAGNOSIS — F5105 Insomnia due to other mental disorder: Secondary | ICD-10-CM

## 2019-12-31 DIAGNOSIS — E538 Deficiency of other specified B group vitamins: Secondary | ICD-10-CM

## 2019-12-31 MED ORDER — VENLAFAXINE HCL ER 150 MG PO CP24: 150.0000 mg | ORAL_CAPSULE | Freq: Every day | ORAL | 0 refills | Status: DC

## 2019-12-31 NOTE — Progress Notes (Signed)
Catherine Haas [id number] Mar 20, 1946 74 y.o.  Subjective:   Patient ID:  Catherine Haas is a 74 y.o. (DOB 12-10-1945) female.  Chief Complaint:  Chief Complaint  Patient presents with  . Follow-up    Medication Management  . Anxiety    Medication Management    Anxiety Symptoms include nervous/anxious behavior. Patient reports no confusion, decreased concentration or suicidal ideas.     Catherine Haas presents to the office today for follow-up of above.   seen October 2020.  She had complaints about her memory.  Laboratory tests were discussed and she was scheduled for an earlier appointment than usual to perform Mini-Mental state exam.  There were no med change at the last visit.  She remains under chronic stress including financial stress caused by having to take care of their son and his wife.  Recently filed bankruptcy.  Last seen December 2020.  She was under a lot of stress with bankruptcy.  She was concerned about attention and concentration and afraid she was getting dementia.  Lab work-up for dementia was unremarkable except low B12 and was suggested she start B12.  Mini-Mental status exam was not suggestive of dementia.   More energy with B12   "So much I had to write it down".  B dying of brown lung.  Son's wife pushing for divorce and the property.  Worry a lot.  Gone back to work after [employer] vaccinated.  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  It's helped me to get out of the house bc so much stress there.  Both stressed and depressed still.  Mali also keeps them upset all the time.  So does his wife [name].    Pleased [place] got disability and that helps.  But his wife is taking his money for disability and putting it in her account.  Mali won't stand up to her stealing bc afraid she'll take the kids from him.  He's Forgetful and been on meds since 2nd grade.  He needs psych care.  Has had his appt cancelled.  Disc her concerns over getting him care bc of provider  illness.  His disability has reduced her stressor.   Very stressed out.  Filed bankruptcy and angry with husband  That he won't challenge kids who are living off of her.   Kids live off them and won't contribute to the household.  Days of shaking all over with the stress. Tired and sleeps with trazodone.  Trying to just take the one clonazepam daily.  Chronic stress but not depressed.  Complained of more difficulty with attention and concentration and short-term memory.  Losing things.  Harder to keep track of schedule.   She still has some issues with sleep from stress even taking trazodone and clonazepam.  Feels the Effexor helps with everyday anxiety or stress.  Has run out and had problems.  Past Psychiatric Medication Trials:    Review of Systems:  Review of Systems  Gastrointestinal: Positive for abdominal pain.  Musculoskeletal: Positive for arthralgias.  Neurological: Negative for tremors, weakness and numbness.  Psychiatric/Behavioral: Negative for agitation, behavioral problems, confusion, decreased concentration, dysphoric mood, hallucinations, self-injury, sleep disturbance and suicidal ideas. The patient is nervous/anxious. The patient is not hyperactive.     Medications: I have reviewed the patient's current medications.  Current Outpatient Medications  Medication Sig Dispense Refill  . alendronate (FOSAMAX) 70 MG tablet Take 70 mg by mouth once a week.    Marland Kitchen amoxicillin (AMOXIL) 500 MG capsule TAKE  4 CAPSULES 1 HOUR BEFORE DENTAL TREATMENT    . ascorbic acid (VITAMIN C) 1000 MG tablet Take by mouth.    Marland Kitchen atenolol (TENORMIN) 25 MG tablet Take 25 mg by mouth daily.    . calcium carbonate (CALCIUM 600) 1500 (600 Ca) MG TABS tablet Take by mouth 2 (two) times daily with a meal.    . clonazePAM (KLONOPIN) 0.5 MG tablet Take 1 tablet (0.5 mg total) by mouth 2 (two) times daily. 180 tablet 1  . Cyanocobalamin (B-12) 2000 MCG TABS TAKE 1 TABLET (2,000 MCG TOTAL) BY MOUTH DAILY. 90  tablet 0  . gabapentin (NEURONTIN) 100 MG capsule Take by mouth.    . losartan (COZAAR) 25 MG tablet Take 25 mg by mouth daily.    . traZODone (DESYREL) 50 MG tablet TAKE 1 TABLET BY MOUTH AT  BEDTIME 90 tablet 3  . venlafaxine XR (EFFEXOR-XR) 150 MG 24 hr capsule Take 1 capsule (150 mg total) by mouth daily with breakfast. 90 capsule 0   No current facility-administered medications for this visit.    Medication Side Effects: None  Allergies:  Allergies  Allergen Reactions  . Codeine Nausea And Vomiting  . Amoxicillin Nausea And Vomiting    History reviewed. No pertinent past medical history.  History reviewed. No pertinent family history.  Social History   Socioeconomic History  . Marital status: Married    Spouse name: Not on file  . Number of children: Not on file  . Years of education: Not on file  . Highest education level: Not on file  Occupational History  . Not on file  Tobacco Use  . Smoking status: Never Smoker  . Smokeless tobacco: Never Used  Substance and Sexual Activity  . Alcohol use: Not on file  . Drug use: Not on file  . Sexual activity: Not on file  Other Topics Concern  . Not on file  Social History Narrative  . Not on file   Social Determinants of Health   Financial Resource Strain:   . Difficulty of Paying Living Expenses: Not on file  Food Insecurity:   . Worried About Programme researcher, broadcasting/film/video in the Last Year: Not on file  . Ran Out of Food in the Last Year: Not on file  Transportation Needs:   . Lack of Transportation (Medical): Not on file  . Lack of Transportation (Non-Medical): Not on file  Physical Activity:   . Days of Exercise per Week: Not on file  . Minutes of Exercise per Session: Not on file  Stress:   . Feeling of Stress : Not on file  Social Connections:   . Frequency of Communication with Friends and Family: Not on file  . Frequency of Social Gatherings with Friends and Family: Not on file  . Attends Religious Services:  Not on file  . Active Member of Clubs or Organizations: Not on file  . Attends Banker Meetings: Not on file  . Marital Status: Not on file  Intimate Partner Violence:   . Fear of Current or Ex-Partner: Not on file  . Emotionally Abused: Not on file  . Physically Abused: Not on file  . Sexually Abused: Not on file    Past Medical History, Surgical history, Social history, and Family history were reviewed and updated as appropriate.   Please see review of systems for further details on the patient's review from today.   Objective:   Physical Exam:  There were no vitals taken for this  visit.  Physical Exam Constitutional:      General: She is not in acute distress.    Appearance: She is well-developed.  Musculoskeletal:        General: No deformity.  Neurological:     Mental Status: She is alert and oriented to person, place, and time.     Motor: No tremor.     Coordination: Coordination normal.     Gait: Gait normal.  Psychiatric:        Attention and Perception: She is attentive.        Mood and Affect: Mood is anxious and depressed. Affect is not labile, blunt, angry or inappropriate.        Speech: Speech normal.        Behavior: Behavior normal.        Thought Content: Thought content normal. Thought content does not include homicidal or suicidal ideation. Thought content does not include homicidal or suicidal plan.        Cognition and Memory: Cognition normal.        Judgment: Judgment normal.     Comments: Insightand judgment fair.  Boundary issues with son. No auditory or visual hallucinations. No delusions.     MMSE = 30/30 10/02/2019  Lab Review:     Component Value Date/Time   NA 142 09/03/2019 0815   K 4.3 09/03/2019 0815   CL 104 09/03/2019 0815   CO2 25 09/03/2019 0815   GLUCOSE 93 09/03/2019 0815   BUN 14 09/03/2019 0815   CREATININE 0.97 09/03/2019 0815   CALCIUM 9.5 09/03/2019 0815   PROT 6.7 09/03/2019 0815   ALBUMIN 4.3 09/03/2019  0815   AST 22 09/03/2019 0815   ALT 13 09/03/2019 0815   ALKPHOS 86 09/03/2019 0815   BILITOT 0.5 09/03/2019 0815   GFRNONAA 58 (L) 09/03/2019 0815   GFRAA 67 09/03/2019 0815       Component Value Date/Time   WBC 5.3 09/03/2019 0815   RBC 4.25 09/03/2019 0815   HGB 13.7 09/03/2019 0815   HCT 40.1 09/03/2019 0815   PLT 213 09/03/2019 0815   MCV 94 09/03/2019 0815   MCH 32.2 09/03/2019 0815   MCHC 34.2 09/03/2019 0815   RDW 12.4 09/03/2019 0815    No results found for: POCLITH, LITHIUM   No results found for: PHENYTOIN, PHENOBARB, VALPROATE, CBMZ   .res Assessment: Plan:    Major depressive disorder, recurrent episode, moderate (HCC) - Plan: venlafaxine XR (EFFEXOR-XR) 150 MG 24 hr capsule  Generalized anxiety disorder - Plan: venlafaxine XR (EFFEXOR-XR) 150 MG 24 hr capsule  Low serum vitamin B12  Insomnia due to mental condition   Greater than 50% of 30 min face to face time with patient was spent on counseling and coordination of care. 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  Overall she feels the stress is better now.  Son's disability has helped.  Disc the ways to prevent this from happening again.  .  It happened bc of using credit card bills and can't do so now.  We discussed the short-term risks associated with benzodiazepines including sedation and increased fall risk among others.  Discussed long-term side effect risk including dependence, potential withdrawal symptoms, and the potential eventual dose-related risk of dementia. She's using the lowest tolerated dose and is satisfied. Sometimes can do without it.  She agrees to trial of 150 mg venlafaxine XR to help with depression and anxiety. Disc SE  She  complains of short-term memory problems that are beginning to interfere with her function.  Disc workup for memory problems.   Comprehensive metabolic panel was within normal limits. Normal folate  level B12 level was low at 142 Vitamin D 64 Normal CBC Normal TSH Trial OTC B12 2000 mcg daily.  FU B12 was 1280.  She has better energy.  Normal clock drawing  Disc results MMSE.  Not suggestive of dementia.  Only abnormality is counting backwards was impaired.  Trazodone helps sleep.  Supportive therapy dealing with stressors.  FU 3 mos  Meredith Staggers, MD, DFAPA   Please see After Visit Summary for patient specific instructions.  No future appointments.  No orders of the defined types were placed in this encounter.     -------------------------------

## 2019-12-31 NOTE — Patient Instructions (Signed)
Increase venlafaxine to 4 of the 37.5 mg capsules which equals 150 mg daily. When the small 37.5 mg capsules are gone the new prescription will be for one of the 150 mg capsules daily.

## 2020-01-13 ENCOUNTER — Telehealth: Payer: Self-pay | Admitting: Psychiatry

## 2020-01-13 NOTE — Telephone Encounter (Signed)
Pt husband Nathalee Smarr called and asked that we change the pharmacy for pt's rx for Vit B12, and Venlafaxine 150mg  to [facility] for 90day supply for future refills. Pt has already got refill for this month.

## 2020-01-14 NOTE — Telephone Encounter (Signed)
Noted  

## 2020-02-11 ENCOUNTER — Other Ambulatory Visit: Payer: Self-pay | Admitting: Psychiatry

## 2020-02-11 DIAGNOSIS — F411 Generalized anxiety disorder: Secondary | ICD-10-CM

## 2020-02-11 DIAGNOSIS — F331 Major depressive disorder, recurrent, moderate: Secondary | ICD-10-CM

## 2020-03-06 ENCOUNTER — Telehealth: Payer: Self-pay | Admitting: Psychiatry

## 2020-03-06 ENCOUNTER — Other Ambulatory Visit: Payer: Self-pay

## 2020-03-06 MED ORDER — CVS VITAMIN B-12 2000 MCG PO TBCR: EXTENDED_RELEASE_TABLET | ORAL | 1 refills | Status: DC

## 2020-03-06 NOTE — Telephone Encounter (Signed)
90 day submitted to [facility]

## 2020-03-06 NOTE — Telephone Encounter (Signed)
Requesting a refill on Vitamin B-12 2000 MCG. She is completely out. Requesting 90 day supply be sent to [facility].

## 2020-03-12 ENCOUNTER — Other Ambulatory Visit: Payer: Self-pay

## 2020-03-12 DIAGNOSIS — F411 Generalized anxiety disorder: Secondary | ICD-10-CM

## 2020-03-12 DIAGNOSIS — F331 Major depressive disorder, recurrent, moderate: Secondary | ICD-10-CM

## 2020-03-12 MED ORDER — CLONAZEPAM 0.5 MG PO TABS: 0.5000 mg | ORAL_TABLET | Freq: Two times a day (BID) | ORAL | 1 refills | Status: DC

## 2020-03-12 MED ORDER — VENLAFAXINE HCL ER 150 MG PO CP24: 150.0000 mg | ORAL_CAPSULE | Freq: Every day | ORAL | 1 refills | Status: DC

## 2020-04-07 ENCOUNTER — Ambulatory Visit (INDEPENDENT_AMBULATORY_CARE_PROVIDER_SITE_OTHER): Payer: Medicare Other | Admitting: Psychiatry

## 2020-04-07 ENCOUNTER — Encounter: Payer: Self-pay | Admitting: Psychiatry

## 2020-04-07 ENCOUNTER — Other Ambulatory Visit: Payer: Self-pay

## 2020-04-07 DIAGNOSIS — E538 Deficiency of other specified B group vitamins: Secondary | ICD-10-CM

## 2020-04-07 DIAGNOSIS — F411 Generalized anxiety disorder: Secondary | ICD-10-CM

## 2020-04-07 DIAGNOSIS — F331 Major depressive disorder, recurrent, moderate: Secondary | ICD-10-CM | POA: Diagnosis not present

## 2020-04-07 DIAGNOSIS — F5105 Insomnia due to other mental disorder: Secondary | ICD-10-CM

## 2020-04-07 DIAGNOSIS — R7989 Other specified abnormal findings of blood chemistry: Secondary | ICD-10-CM

## 2020-04-07 NOTE — Progress Notes (Signed)
Catherine Haas [id number] 06-Aug-1946 74 y.o.  Subjective:   Patient ID:  Catherine Haas is a 74 y.o. (DOB [birth year]) female.  Chief Complaint:  Chief Complaint  Patient presents with  . Follow-up  . Depression  . Anxiety    Anxiety Symptoms include nervous/anxious behavior. Patient reports no confusion, decreased concentration, dizziness or suicidal ideas.     Catherine Haas presents to the office today for follow-up of above.   seen October 2020.  She had complaints about her memory.  Laboratory tests were discussed and she was scheduled for an earlier appointment than usual to perform Mini-Mental state exam.  There were no med change at the last visit.  She remains under chronic stress including financial stress caused by having to take care of their son and his wife.  Recently filed bankruptcy.  seen December 2020.  She was under a lot of stress with bankruptcy.  She was concerned about attention and concentration and afraid she was getting dementia.  Lab work-up for dementia was unremarkable except low B12 and was suggested she start B12.  Mini-Mental status exam was not suggestive of dementia.    March 2021 appointment the following is noted: More energy with B12  "So much I had to write it down".  B dying of brown lung.  Son's wife pushing for divorce and the property.  Worry a lot.  Gone back to work after [name] vaccinated.  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  It's helped me to get out of the house bc so much stress there.  Both stressed and depressed still.  [name] also keeps them upset all the time.  So does his wife [name].   Pleased [name] got disability and that helps.  But his wife is taking his money for disability and putting it in her account.  [name] won't stand up to her stealing bc afraid she'll take the kids from him.  He's Forgetful and been on meds since 2nd grade.  He needs psych care.  Has had his appt cancelled.  Disc her concerns over getting him care bc of  provider illness.  His disability has reduced her stressor.  Very stressed out.  Filed bankruptcy and angry with husband  That he won't challenge kids who are living off of her.   Kids live off them and won't contribute to the household.  Days of shaking all over with the stress. Tired and sleeps with trazodone.  Trying to just take the one clonazepam daily.  Chronic stress but not depressed.  Complained of more difficulty with attention and concentration and short-term memory.  Losing things.  Harder to keep track of schedule.  She still has some issues with sleep from stress even taking trazodone and clonazepam. Feels the Effexor helps with everyday anxiety or stress.  Has run out and had problems. Plan: Increase venlafaxine XR 150 mg daily plus trazodone and clonazepam 0.5 mg twice daily.  04/07/2020 appointment the following is noted: Things are not notably better.  She feels under the circumstances she's doing OK.  2 beloved dogs died and B died since here.  B died at home. [name] called [name] wanting to get back together.  She was unfaithful.  Hard on kids with joint custody.  Better for pt without [name] present.   But [name] (son) is hard to deal with DT irritability. Sleeps with trazodone. Thinks maybe the increase in venlafaxine was helpful but not dramatic.  Is well tolerated. Clonazepam is consistent BID. Surgery  last Feb on foot.  Past Psychiatric Medication Trials:  Venlafaxine 112.5 mg Until March 2021 incr to 150 Clonazepam.  Review of Systems:  Review of Systems  Musculoskeletal: Positive for arthralgias.  Neurological: Negative for dizziness, tremors, weakness and numbness.  Psychiatric/Behavioral: Negative for agitation, behavioral problems, confusion, decreased concentration, dysphoric mood, hallucinations, self-injury, sleep disturbance and suicidal ideas. The patient is nervous/anxious. The patient is not hyperactive.     Medications: I have reviewed the patient's current  medications.  Current Outpatient Medications  Medication Sig Dispense Refill  . alendronate (FOSAMAX) 70 MG tablet Take 70 mg by mouth once a week.    Marland Kitchen amoxicillin (AMOXIL) 500 MG capsule TAKE 4 CAPSULES 1 HOUR BEFORE DENTAL TREATMENT    . ascorbic acid (VITAMIN C) 1000 MG tablet Take by mouth.    Marland Kitchen atenolol (TENORMIN) 25 MG tablet Take 25 mg by mouth daily.    . calcium carbonate (CALCIUM 600) 1500 (600 Ca) MG TABS tablet Take by mouth 2 (two) times daily with a meal.    . clonazePAM (KLONOPIN) 0.5 MG tablet Take 1 tablet (0.5 mg total) by mouth 2 (two) times daily. 180 tablet 1  . Cyanocobalamin ([facility] VITAMIN B-12) 2000 MCG TBCR TAKE 1 TABLET (2,000 MCG TOTAL) BY MOUTH DAILY. 90 tablet 1  . gabapentin (NEURONTIN) 100 MG capsule Take by mouth.    . losartan (COZAAR) 25 MG tablet Take 25 mg by mouth daily.    . traZODone (DESYREL) 50 MG tablet TAKE 1 TABLET BY MOUTH AT  BEDTIME 90 tablet 3  . venlafaxine XR (EFFEXOR-XR) 150 MG 24 hr capsule Take 1 capsule (150 mg total) by mouth daily with breakfast. 90 capsule 1   No current facility-administered medications for this visit.    Medication Side Effects: None  Allergies:  Allergies  Allergen Reactions  . Codeine Nausea And Vomiting  . Amoxicillin Nausea And Vomiting    History reviewed. No pertinent past medical history.  History reviewed. No pertinent family history.  Social History   Socioeconomic History  . Marital status: Married    Spouse name: Not on file  . Number of children: Not on file  . Years of education: Not on file  . Highest education level: Not on file  Occupational History  . Not on file  Tobacco Use  . Smoking status: Never Smoker  . Smokeless tobacco: Never Used  Substance and Sexual Activity  . Alcohol use: Not on file  . Drug use: Not on file  . Sexual activity: Not on file  Other Topics Concern  . Not on file  Social History Narrative  . Not on file   Social Determinants of Health    Financial Resource Strain:   . Difficulty of Paying Living Expenses:   Food Insecurity:   . Worried About Programme researcher, broadcasting/film/video in the Last Year:   . Barista in the Last Year:   Transportation Needs:   . Freight forwarder (Medical):   Marland Kitchen Lack of Transportation (Non-Medical):   Physical Activity:   . Days of Exercise per Week:   . Minutes of Exercise per Session:   Stress:   . Feeling of Stress :   Social Connections:   . Frequency of Communication with Friends and Family:   . Frequency of Social Gatherings with Friends and Family:   . Attends Religious Services:   . Active Member of Clubs or Organizations:   . Attends Banker Meetings:   .  Marital Status:   Intimate Partner Violence:   . Fear of Current or Ex-Partner:   . Emotionally Abused:   Marland Kitchen Physically Abused:   . Sexually Abused:     Past Medical History, Surgical history, Social history, and Family history were reviewed and updated as appropriate.   Please see review of systems for further details on the patient's review from today.   Objective:   Physical Exam:  There were no vitals taken for this visit.  Physical Exam Constitutional:      General: She is not in acute distress.    Appearance: She is well-developed.  Musculoskeletal:        General: No deformity.  Neurological:     Mental Status: She is alert and oriented to person, place, and time.     Motor: No tremor.     Coordination: Coordination normal.     Gait: Gait normal.  Psychiatric:        Attention and Perception: She is attentive.        Mood and Affect: Mood is anxious and depressed. Affect is not labile, blunt, angry, tearful or inappropriate.        Speech: Speech normal.        Behavior: Behavior normal.        Thought Content: Thought content normal. Thought content does not include homicidal or suicidal ideation. Thought content does not include homicidal or suicidal plan.        Cognition and Memory: Cognition  normal.        Judgment: Judgment normal.     Comments: Insightand judgment fair.  Boundary issues with son. No auditory or visual hallucinations. No delusions.  No marked changes.  Chronic stress with [place]    MMSE = 30/30 10/02/2019  Lab Review:     Component Value Date/Time   NA 142 09/03/2019 0815   K 4.3 09/03/2019 0815   CL 104 09/03/2019 0815   CO2 25 09/03/2019 0815   GLUCOSE 93 09/03/2019 0815   BUN 14 09/03/2019 0815   CREATININE 0.97 09/03/2019 0815   CALCIUM 9.5 09/03/2019 0815   PROT 6.7 09/03/2019 0815   ALBUMIN 4.3 09/03/2019 0815   AST 22 09/03/2019 0815   ALT 13 09/03/2019 0815   ALKPHOS 86 09/03/2019 0815   BILITOT 0.5 09/03/2019 0815   GFRNONAA 58 (L) 09/03/2019 0815   GFRAA 67 09/03/2019 0815       Component Value Date/Time   WBC 5.3 09/03/2019 0815   RBC 4.25 09/03/2019 0815   HGB 13.7 09/03/2019 0815   HCT 40.1 09/03/2019 0815   PLT 213 09/03/2019 0815   MCV 94 09/03/2019 0815   MCH 32.2 09/03/2019 0815   MCHC 34.2 09/03/2019 0815   RDW 12.4 09/03/2019 0815    No results found for: POCLITH, LITHIUM   No results found for: PHENYTOIN, PHENOBARB, VALPROATE, CBMZ   .res Assessment: Plan:    Major depressive disorder, recurrent episode, moderate (HCC)  Generalized anxiety disorder  Low serum vitamin B12  Insomnia due to mental condition  Low vitamin D level   Greater than 50% of 30 min face to face time with patient was spent on counseling and coordination of care. 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  Overall she feels the stress is better now.  Son's disability has helped.  Disc the ways to prevent this from happening again.  .  It happened bc of using credit card bills  and can't do so now.  We discussed the short-term risks associated with benzodiazepines including sedation and increased fall risk among others.  Discussed long-term side effect risk including dependence,  potential withdrawal symptoms, and the potential eventual dose-related risk of dementia. She's using the lowest tolerated dose and is satisfied. Sometimes can do without it.  She continues the previously increased 150 mg venlafaxine XR to help with depression and anxiety. Disc SE.  She has none.  She complains of short-term memory problems that are beginning to interfere with her function.  Disc workup for memory problems.   Comprehensive metabolic panel was within normal limits. Normal folate level B12 level was low at 142 Vitamin D 64 Normal CBC Normal TSH Trial OTC B12 2000 mcg daily.  FU B12 was 1280.  She has better energy.  Normal clock drawing  Disc results MMSE.  Not suggestive of dementia.  Only abnormality is counting backwards was impaired.  Trazodone helps sleep.  Supportive therapy dealing with stressors.  FU 6 mos  Meredith Staggers, MD, DFAPA   Please see After Visit Summary for patient specific instructions.  No future appointments.  No orders of the defined types were placed in this encounter.     -------------------------------

## 2020-05-27 ENCOUNTER — Telehealth: Payer: Self-pay | Admitting: Psychiatry

## 2020-05-27 NOTE — Telephone Encounter (Signed)
Pt called stated should be getting  VITAMIN B12 2000 mg  90 day from [facility]. Just picked up 60 day @ local [facility]. Please set up refills for [facility] mail order

## 2020-05-29 ENCOUNTER — Other Ambulatory Visit: Payer: Self-pay

## 2020-05-29 MED ORDER — CVS VITAMIN B-12 2000 MCG PO TBCR: EXTENDED_RELEASE_TABLET | ORAL | 1 refills | Status: DC

## 2020-05-29 NOTE — Telephone Encounter (Signed)
90 day sent to [facility] per request

## 2020-06-05 ENCOUNTER — Other Ambulatory Visit: Payer: Self-pay | Admitting: Psychiatry

## 2020-08-17 ENCOUNTER — Other Ambulatory Visit: Payer: Self-pay | Admitting: Psychiatry

## 2020-10-07 ENCOUNTER — Ambulatory Visit: Payer: Medicare Other | Admitting: Psychiatry

## 2020-10-20 ENCOUNTER — Other Ambulatory Visit: Payer: Self-pay | Admitting: Psychiatry

## 2020-10-20 DIAGNOSIS — F331 Major depressive disorder, recurrent, moderate: Secondary | ICD-10-CM

## 2020-10-20 DIAGNOSIS — F411 Generalized anxiety disorder: Secondary | ICD-10-CM

## 2020-10-24 HISTORY — PX: BLEPHAROPLASTY: SUR158

## 2020-11-23 ENCOUNTER — Encounter: Payer: Self-pay | Admitting: Psychiatry

## 2020-11-23 ENCOUNTER — Telehealth (INDEPENDENT_AMBULATORY_CARE_PROVIDER_SITE_OTHER): Payer: Medicare Other | Admitting: Psychiatry

## 2020-11-23 DIAGNOSIS — F411 Generalized anxiety disorder: Secondary | ICD-10-CM

## 2020-11-23 DIAGNOSIS — F5105 Insomnia due to other mental disorder: Secondary | ICD-10-CM

## 2020-11-23 DIAGNOSIS — F331 Major depressive disorder, recurrent, moderate: Secondary | ICD-10-CM | POA: Diagnosis not present

## 2020-11-23 DIAGNOSIS — R7989 Other specified abnormal findings of blood chemistry: Secondary | ICD-10-CM

## 2020-11-23 DIAGNOSIS — E538 Deficiency of other specified B group vitamins: Secondary | ICD-10-CM

## 2020-11-23 DIAGNOSIS — G3184 Mild cognitive impairment, so stated: Secondary | ICD-10-CM

## 2020-11-23 MED ORDER — CLONAZEPAM 0.5 MG PO TABS: 0.5000 mg | ORAL_TABLET | Freq: Two times a day (BID) | ORAL | 1 refills | Status: DC

## 2020-11-23 NOTE — Progress Notes (Signed)
Catherine Haas [id number] 07-14-1946 75 y.o.  Subjective:   Patient ID:  Catherine Haas is a 75 y.o. (DOB [birth year]) female.  Chief Complaint:  Chief Complaint  Patient presents with  . Follow-up  . Anxiety  . Depression  . Sleeping Problem    Anxiety Symptoms include nervous/anxious behavior. Patient reports no confusion, decreased concentration, dizziness, palpitations or suicidal ideas.     Onetta Spainhower presents to the office today for follow-up of above.   seen October 2020.  She had complaints about her memory.  Laboratory tests were discussed and she was scheduled for an earlier appointment than usual to perform Mini-Mental state exam.  There were no med change at the last visit.  She remains under chronic stress including financial stress caused by having to take care of their son and his wife.  Recently filed bankruptcy.  seen December 2020.  She was under a lot of stress with bankruptcy.  She was concerned about attention and concentration and afraid she was getting dementia.  Lab work-up for dementia was unremarkable except low B12 and was suggested she start B12.  Mini-Mental status exam was not suggestive of dementia.    March 2021 appointment the following is noted: More energy with B12  "So much I had to write it down".  B dying of brown lung.  Son's wife pushing for divorce and the property.  Worry a lot.  Gone back to work after [name] vaccinated.  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  It's helped me to get out of the house bc so much stress there.  Both stressed and depressed still.  [name] also keeps them upset all the time.  So does his wife [name].   Pleased [name] got disability and that helps.  But his wife is taking his money for disability and putting it in her account.  [name] won't stand up to her stealing bc afraid she'll take the kids from him.  He's Forgetful and been on meds since 2nd grade.  He needs psych care.  Has had his appt cancelled.  Disc her  concerns over getting him care bc of provider illness.  His disability has reduced her stressor.  Very stressed out.  Filed bankruptcy and angry with husband  That he won't challenge kids who are living off of her.   Kids live off them and won't contribute to the household.  Days of shaking all over with the stress. Tired and sleeps with trazodone.  Trying to just take the one clonazepam daily.  Chronic stress but not depressed.  Complained of more difficulty with attention and concentration and short-term memory.  Losing things.  Harder to keep track of schedule.  She still has some issues with sleep from stress even taking trazodone and clonazepam. Feels the Effexor helps with everyday anxiety or stress.  Has run out and had problems. Plan: Increase venlafaxine XR 150 mg daily plus trazodone and clonazepam 0.5 mg twice daily.  04/07/2020 appointment the following is noted: Things are not notably better.  She feels under the circumstances she's doing OK.  2 beloved dogs died and B died since here.  B died at home. [name] called [name] wanting to get back together.  She was unfaithful.  Hard on kids with joint custody.  Better for pt without [name] present.   But [name] (son) is hard to deal with DT irritability. Sleeps with trazodone. Thinks maybe the increase in venlafaxine was helpful but not dramatic.  Is well tolerated.  Clonazepam is consistent BID. Surgery last Feb on foot.  Plan continue B12  11/23/2020 appt noted: Had Covid after Christmas.  And the whole family.  Recovered except some fatigue.  [name]'s divorce is a ongoing stress.  Busy.  Keep maintaining along.  No med changes. Goes to gym usually.   No problems with meds.   Takes clonazepam and trazodone.  Needs both bc stays stress at home.   Satisfied with meds.  No SE.  Doing water exercise.   Past Psychiatric Medication Trials:  Venlafaxine 112.5 mg Until March 2021 incr to 150 Clonazepam.  Review of Systems:  Review of Systems   Cardiovascular: Negative for palpitations.  Musculoskeletal: Positive for arthralgias.  Neurological: Negative for dizziness, tremors, weakness and numbness.  Psychiatric/Behavioral: Negative for agitation, behavioral problems, confusion, decreased concentration, dysphoric mood, hallucinations, self-injury, sleep disturbance and suicidal ideas. The patient is nervous/anxious. The patient is not hyperactive.     Medications: I have reviewed the patient's current medications.  Current Outpatient Medications  Medication Sig Dispense Refill  . alendronate (FOSAMAX) 70 MG tablet Take 70 mg by mouth once a week.    Marland Kitchen amoxicillin (AMOXIL) 500 MG capsule TAKE 4 CAPSULES 1 HOUR BEFORE DENTAL TREATMENT    . ascorbic acid (VITAMIN C) 1000 MG tablet Take by mouth.    Marland Kitchen atenolol (TENORMIN) 25 MG tablet Take 25 mg by mouth daily.    . calcium carbonate (OSCAL) 1500 (600 Ca) MG TABS tablet Take by mouth 2 (two) times daily with a meal.    . clonazePAM (KLONOPIN) 0.5 MG tablet TAKE 1 TABLET BY MOUTH  TWICE DAILY 180 tablet 1  . [facility] VITAMIN B-12 2000 MCG TBCR TAKE 1 TABLET (2,000 MCG TOTAL) BY MOUTH DAILY. 60 tablet 1  . gabapentin (NEURONTIN) 100 MG capsule Take by mouth.    . losartan (COZAAR) 25 MG tablet Take 25 mg by mouth daily.    . traZODone (DESYREL) 50 MG tablet TAKE 1 TABLET BY MOUTH AT  BEDTIME 90 tablet 3  . venlafaxine XR (EFFEXOR-XR) 150 MG 24 hr capsule TAKE 1 CAPSULE BY MOUTH  DAILY WITH BREAKFAST 90 capsule 3   No current facility-administered medications for this visit.    Medication Side Effects: None  Allergies:  Allergies  Allergen Reactions  . Codeine Nausea And Vomiting  . Amoxicillin Nausea And Vomiting    History reviewed. No pertinent past medical history.  History reviewed. No pertinent family history.  Social History   Socioeconomic History  . Marital status: Married    Spouse name: Not on file  . Number of children: Not on file  . Years of education: Not on  file  . Highest education level: Not on file  Occupational History  . Not on file  Tobacco Use  . Smoking status: Never Smoker  . Smokeless tobacco: Never Used  Substance and Sexual Activity  . Alcohol use: Not on file  . Drug use: Not on file  . Sexual activity: Not on file  Other Topics Concern  . Not on file  Social History Narrative  . Not on file   Social Determinants of Health   Financial Resource Strain: Not on file  Food Insecurity: Not on file  Transportation Needs: Not on file  Physical Activity: Not on file  Stress: Not on file  Social Connections: Not on file  Intimate Partner Violence: Not on file    Past Medical History, Surgical history, Social history, and Family history were reviewed and updated  as appropriate.   Please see review of systems for further details on the patient's review from today.   Objective:   Physical Exam:  There were no vitals taken for this visit.  Physical Exam Constitutional:      General: She is not in acute distress.    Appearance: She is well-developed.  Musculoskeletal:        General: No deformity.  Neurological:     Mental Status: She is alert and oriented to person, place, and time.     Motor: No tremor.     Coordination: Coordination normal.     Gait: Gait normal.  Psychiatric:        Attention and Perception: She is attentive.        Mood and Affect: Mood is anxious and depressed. Affect is not labile, blunt, angry, tearful or inappropriate.        Speech: Speech normal.        Behavior: Behavior normal.        Thought Content: Thought content normal. Thought content does not include homicidal or suicidal ideation. Thought content does not include homicidal or suicidal plan.        Cognition and Memory: Cognition normal.     Comments: Insightand judgment fair.  Boundary issues with son. No auditory or visual hallucinations. No delusions.  No marked changes.  Chronic stress with [name] ongoing    MMSE = 30/30  10/02/2019  Lab Review:     Component Value Date/Time   NA 142 09/03/2019 0815   K 4.3 09/03/2019 0815   CL 104 09/03/2019 0815   CO2 25 09/03/2019 0815   GLUCOSE 93 09/03/2019 0815   BUN 14 09/03/2019 0815   CREATININE 0.97 09/03/2019 0815   CALCIUM 9.5 09/03/2019 0815   PROT 6.7 09/03/2019 0815   ALBUMIN 4.3 09/03/2019 0815   AST 22 09/03/2019 0815   ALT 13 09/03/2019 0815   ALKPHOS 86 09/03/2019 0815   BILITOT 0.5 09/03/2019 0815   GFRNONAA 58 (L) 09/03/2019 0815   GFRAA 67 09/03/2019 0815       Component Value Date/Time   WBC 5.3 09/03/2019 0815   RBC 4.25 09/03/2019 0815   HGB 13.7 09/03/2019 0815   HCT 40.1 09/03/2019 0815   PLT 213 09/03/2019 0815   MCV 94 09/03/2019 0815   MCH 32.2 09/03/2019 0815   MCHC 34.2 09/03/2019 0815   RDW 12.4 09/03/2019 0815    No results found for: POCLITH, LITHIUM   No results found for: PHENYTOIN, PHENOBARB, VALPROATE, CBMZ   .res Assessment: Plan:    Major depressive disorder, recurrent episode, moderate (HCC)  Generalized anxiety disorder  Low serum vitamin B12  Insomnia due to mental condition  Low vitamin D level  Mild cognitive impairment   Greater than 50% of 30 min face to face time with patient was spent on counseling and coordination of care. 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  Overall she feels the stress is better now.  Son's disability has helped.  Disc the ways to prevent this from happening again.  .  It happened bc of using credit card bills and can't do so now.  We discussed the short-term risks associated with benzodiazepines including sedation and increased fall risk among others.  Discussed long-term side effect risk including dependence, potential withdrawal symptoms, and the potential eventual dose-related risk of dementia. She's using the lowest tolerated dose and is satisfied. Sometimes can do  without it.  She continues the  previously increased 150 mg venlafaxine XR to help with depression and anxiety. Disc SE.  She has none.  She complains of short-term memory problems that are beginning to interfere with her function.  Disc workup for memory problems.   Comprehensive metabolic panel was within normal limits. Normal folate level History of low B12 level was low at 142 Vitamin D 64 Normal CBC Normal TSH Continue OTC B12 2000 mcg daily.  FU B12 was 1280.  She has better energy.  No med changes  Trazodone helps sleep.  Supportive therapy dealing with stressors.  FU 6 mos  Meredith Staggers, MD, DFAPA   Please see After Visit Summary for patient specific instructions.  No future appointments.  No orders of the defined types were placed in this encounter.     -------------------------------

## 2021-04-28 ENCOUNTER — Other Ambulatory Visit: Payer: Self-pay | Admitting: Psychiatry

## 2021-04-28 ENCOUNTER — Telehealth: Payer: Self-pay | Admitting: Psychiatry

## 2021-04-28 DIAGNOSIS — F411 Generalized anxiety disorder: Secondary | ICD-10-CM

## 2021-04-28 DIAGNOSIS — F5105 Insomnia due to other mental disorder: Secondary | ICD-10-CM

## 2021-04-28 MED ORDER — CLONAZEPAM 0.5 MG PO TABS: 0.5000 mg | ORAL_TABLET | Freq: Three times a day (TID) | ORAL | 1 refills | Status: DC | PRN

## 2021-04-28 NOTE — Telephone Encounter (Signed)
Pt would like a call back to discuss something that has happened in her family. Pt has taken all her clonazepam and would like a rx for 33 tablets to be sent in at [facility] [place].

## 2021-04-28 NOTE — Telephone Encounter (Signed)
Pt stated she is going through a very stressful situation with her family and did not want to discuss over the phone.She said kayla knows the situation if you want to discuss with her.She has taken all her klonopin as mentioned above and requesting a refill to replace the extra that was taken.
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
## 2021-04-28 NOTE — Telephone Encounter (Signed)
[facility] called to let you know that Dorathy Daft sees her son [name] Fuson and is aware of what is going on and why she has taken all her medication.

## 2021-05-20 ENCOUNTER — Ambulatory Visit: Payer: Medicare Other | Admitting: Psychiatry

## 2021-05-26 ENCOUNTER — Ambulatory Visit: Payer: Medicare Other | Admitting: Psychiatry

## 2021-05-27 ENCOUNTER — Ambulatory Visit (INDEPENDENT_AMBULATORY_CARE_PROVIDER_SITE_OTHER): Payer: Medicare Other | Admitting: Psychiatry

## 2021-05-27 ENCOUNTER — Other Ambulatory Visit: Payer: Self-pay

## 2021-05-27 ENCOUNTER — Encounter: Payer: Self-pay | Admitting: Psychiatry

## 2021-05-27 DIAGNOSIS — G3184 Mild cognitive impairment, so stated: Secondary | ICD-10-CM

## 2021-05-27 DIAGNOSIS — F5105 Insomnia due to other mental disorder: Secondary | ICD-10-CM | POA: Diagnosis not present

## 2021-05-27 DIAGNOSIS — F331 Major depressive disorder, recurrent, moderate: Secondary | ICD-10-CM | POA: Diagnosis not present

## 2021-05-27 DIAGNOSIS — F411 Generalized anxiety disorder: Secondary | ICD-10-CM | POA: Diagnosis not present

## 2021-05-27 DIAGNOSIS — E538 Deficiency of other specified B group vitamins: Secondary | ICD-10-CM | POA: Diagnosis not present

## 2021-05-27 DIAGNOSIS — R7989 Other specified abnormal findings of blood chemistry: Secondary | ICD-10-CM

## 2021-05-27 MED ORDER — VENLAFAXINE HCL ER 150 MG PO CP24: 150.0000 mg | ORAL_CAPSULE | Freq: Every day | ORAL | 3 refills | Status: DC

## 2021-05-27 MED ORDER — CLONAZEPAM 0.5 MG PO TABS: 0.5000 mg | ORAL_TABLET | Freq: Three times a day (TID) | ORAL | 5 refills | Status: DC | PRN
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
## 2021-07-02 ENCOUNTER — Other Ambulatory Visit: Payer: Self-pay | Admitting: Psychiatry

## 2021-08-26 ENCOUNTER — Telehealth: Payer: Self-pay | Admitting: Psychiatry

## 2021-08-26 ENCOUNTER — Other Ambulatory Visit: Payer: Self-pay

## 2021-08-26 DIAGNOSIS — F331 Major depressive disorder, recurrent, moderate: Secondary | ICD-10-CM

## 2021-08-26 DIAGNOSIS — F411 Generalized anxiety disorder: Secondary | ICD-10-CM

## 2021-08-26 MED ORDER — VENLAFAXINE HCL ER 150 MG PO CP24: 150.0000 mg | ORAL_CAPSULE | Freq: Every day | ORAL | 0 refills | Status: DC

## 2021-08-26 MED ORDER — VENLAFAXINE HCL ER 150 MG PO CP24
150.0000 mg | ORAL_CAPSULE | Freq: Three times a day (TID) | ORAL | 0 refills | Status: DC
Start: 2021-08-26 — End: 2021-08-26

## 2021-08-26 MED ORDER — VENLAFAXINE HCL ER 150 MG PO CP24: 150.0000 mg | ORAL_CAPSULE | Freq: Three times a day (TID) | ORAL | 2 refills | Status: DC

## 2021-08-26 NOTE — Telephone Encounter (Signed)
Rx sent 

## 2021-08-26 NOTE — Telephone Encounter (Signed)
Patient's husband called in for a temporary refill for Venlafaxine XR 150mg . States that prescription was increased to three a day and therefore she doesn't have enough to get to next refill day. Would also like new prescription to be sent to regular pharmacy. Ph: [phone]. Appt 2/7. [facility] [address] [place]. Normal pharmacy [facility] [address] [place]

## 2021-08-27 ENCOUNTER — Other Ambulatory Visit: Payer: Self-pay | Admitting: Psychiatry

## 2021-08-27 DIAGNOSIS — F331 Major depressive disorder, recurrent, moderate: Secondary | ICD-10-CM

## 2021-08-27 DIAGNOSIS — F411 Generalized anxiety disorder: Secondary | ICD-10-CM

## 2021-08-27 MED ORDER — VENLAFAXINE HCL ER 150 MG PO CP24: 150.0000 mg | ORAL_CAPSULE | Freq: Every day | ORAL | 1 refills | Status: DC

## 2021-08-27 NOTE — Progress Notes (Signed)
Dosage was corrected.  The patient is not on venlafaxine XR 450 mg daily.  She is on venlafaxine XR 150 mg capsule 1 daily.  We will verify with the patient she is taking the correct dosage.

## 2021-10-24 HISTORY — PX: CYSTOSCOPY W/ URETERAL STENT PLACEMENT: SHX1429

## 2021-11-23 ENCOUNTER — Other Ambulatory Visit: Payer: Self-pay | Admitting: Psychiatry

## 2021-11-30 ENCOUNTER — Ambulatory Visit: Payer: Medicare Other | Admitting: Psychiatry

## 2021-12-13 ENCOUNTER — Other Ambulatory Visit: Payer: Self-pay | Admitting: Psychiatry

## 2021-12-13 DIAGNOSIS — F411 Generalized anxiety disorder: Secondary | ICD-10-CM

## 2021-12-13 DIAGNOSIS — F5105 Insomnia due to other mental disorder: Secondary | ICD-10-CM

## 2022-01-11 ENCOUNTER — Encounter: Payer: Medicare Other | Admitting: Psychiatry

## 2022-01-11 ENCOUNTER — Other Ambulatory Visit: Payer: Self-pay

## 2022-01-18 ENCOUNTER — Telehealth: Payer: Self-pay | Admitting: Psychiatry

## 2022-01-18 ENCOUNTER — Other Ambulatory Visit: Payer: Self-pay | Admitting: Psychiatry

## 2022-01-18 DIAGNOSIS — F5105 Insomnia due to other mental disorder: Secondary | ICD-10-CM

## 2022-01-18 DIAGNOSIS — F411 Generalized anxiety disorder: Secondary | ICD-10-CM

## 2022-01-18 MED ORDER — CLONAZEPAM 0.5 MG PO TABS: 0.5000 mg | ORAL_TABLET | Freq: Three times a day (TID) | ORAL | 1 refills | Status: DC | PRN

## 2022-01-18 NOTE — Telephone Encounter (Signed)
Catherine Haas called to day at 10:40am to request refill of her Clonazepam.  She was late for her appointment 3/21 so she wasn't seen.  She is RS for 4/25 but she needs a refill of her medication.  [facility] [address] [facility] [place] ?

## 2022-01-18 NOTE — Telephone Encounter (Signed)
Sent!

## 2022-01-19 NOTE — Progress Notes (Signed)
This encounter was created in error - please disregard. ?Patient arrived too late to be seen ?

## 2022-02-15 ENCOUNTER — Encounter: Payer: Self-pay | Admitting: Psychiatry

## 2022-02-15 ENCOUNTER — Ambulatory Visit (INDEPENDENT_AMBULATORY_CARE_PROVIDER_SITE_OTHER): Payer: Medicare Other | Admitting: Psychiatry

## 2022-02-15 DIAGNOSIS — F411 Generalized anxiety disorder: Secondary | ICD-10-CM

## 2022-02-15 DIAGNOSIS — F331 Major depressive disorder, recurrent, moderate: Secondary | ICD-10-CM | POA: Diagnosis not present

## 2022-02-15 DIAGNOSIS — F5105 Insomnia due to other mental disorder: Secondary | ICD-10-CM | POA: Diagnosis not present

## 2022-02-15 MED ORDER — TRAZODONE HCL 50 MG PO TABS: 50.0000 mg | ORAL_TABLET | Freq: Every day | ORAL | 3 refills | Status: DC

## 2022-02-15 MED ORDER — VENLAFAXINE HCL ER 150 MG PO CP24: 150.0000 mg | ORAL_CAPSULE | Freq: Every day | ORAL | 1 refills | Status: DC

## 2022-02-15 MED ORDER — CLONAZEPAM 0.5 MG PO TABS: 0.5000 mg | ORAL_TABLET | Freq: Three times a day (TID) | ORAL | 4 refills | Status: DC | PRN

## 2022-02-15 MED ORDER — CLONAZEPAM 0.5 MG PO TABS: 0.5000 mg | ORAL_TABLET | Freq: Three times a day (TID) | ORAL | 0 refills | Status: DC | PRN
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
## 2022-03-18 ENCOUNTER — Other Ambulatory Visit: Payer: Self-pay | Admitting: Psychiatry

## 2022-03-18 ENCOUNTER — Telehealth: Payer: Self-pay | Admitting: Psychiatry

## 2022-03-18 NOTE — Telephone Encounter (Signed)
The PMP database shows medication shipped. I called [facility] to verify. It did ship 5/25 but there is no estimated delivery time. LVM for patient to [facility]. She states she has enough until Tuesday. Will call Tuesday morning to track and send local refill if needed.

## 2022-03-18 NOTE — Telephone Encounter (Signed)
Patient lvm requesting a refill on the Clonazepam. She stated she will be out of her 90 day supply on Tuesday. [facility] [address] [facility] [place]. Contact if needed at # [phone].

## 2022-03-18 NOTE — Telephone Encounter (Signed)
We sent prescriptions for 90 capsules which is a 1 month supply to [facility].  Is that not where she wants the clonazepam prescriptions sent?  She has multiple refills at [facility]

## 2022-03-22 ENCOUNTER — Other Ambulatory Visit: Payer: Self-pay

## 2022-03-22 DIAGNOSIS — F411 Generalized anxiety disorder: Secondary | ICD-10-CM

## 2022-03-22 DIAGNOSIS — F5105 Insomnia due to other mental disorder: Secondary | ICD-10-CM

## 2022-03-22 MED ORDER — CLONAZEPAM 0.5 MG PO TABS: 0.5000 mg | ORAL_TABLET | Freq: Three times a day (TID) | ORAL | 0 refills | Status: DC | PRN

## 2022-03-22 NOTE — Telephone Encounter (Signed)
Called [facility] to check status of refill. They said it was shipped 5/25 but do not have any tracking information available. It was sent via [employer] and yesterday was a holiday. Will pend an Rx for a few tablets to get her thru until her mail order supply arrives.

## 2022-03-22 NOTE — Telephone Encounter (Signed)
Pended refill to local pharmacy to cover her until [facility] order arrives.

## 2022-04-11 ENCOUNTER — Other Ambulatory Visit (HOSPITAL_COMMUNITY): Payer: Medicare Other

## 2022-04-19 ENCOUNTER — Ambulatory Visit: Admit: 2022-04-19 | Payer: Medicare Other | Admitting: Orthopedic Surgery

## 2022-04-19 DIAGNOSIS — M1711 Unilateral primary osteoarthritis, right knee: Secondary | ICD-10-CM

## 2022-04-19 SURGERY — ARTHROPLASTY, KNEE, TOTAL
Anesthesia: Spinal | Site: Knee | Laterality: Right

## 2022-07-04 ENCOUNTER — Telehealth: Payer: Self-pay | Admitting: Psychiatry

## 2022-07-04 DIAGNOSIS — F411 Generalized anxiety disorder: Secondary | ICD-10-CM

## 2022-07-04 DIAGNOSIS — F5105 Insomnia due to other mental disorder: Secondary | ICD-10-CM

## 2022-07-04 NOTE — Telephone Encounter (Signed)
[facility] has refills. I called and requested a RF be sent.  Patient is not set up for automatic refill so patient has to call to request a refill. Tried to call patient but no answer and no way to leave a message.

## 2022-07-04 NOTE — Telephone Encounter (Signed)
Pt called reporting Rx sent in for 21 pills of clonazepam until mail order arrived. Can't tell if Rx was sent to [facility] mail order or canc. Advise pt. [phone]

## 2022-07-04 NOTE — Telephone Encounter (Signed)
What medication

## 2022-07-05 NOTE — Telephone Encounter (Signed)
LVM to [facility]. Patient was asking for a local RF on clonazepam. Talked with [employer] today and promised date is 9/18. Want to know if patient has enough to get her thru that date, or how many to send in if needed now.

## 2022-07-06 ENCOUNTER — Other Ambulatory Visit: Payer: Self-pay

## 2022-07-06 DIAGNOSIS — F411 Generalized anxiety disorder: Secondary | ICD-10-CM

## 2022-07-06 DIAGNOSIS — F5105 Insomnia due to other mental disorder: Secondary | ICD-10-CM

## 2022-07-06 MED ORDER — CLONAZEPAM 0.5 MG PO TABS: 0.5000 mg | ORAL_TABLET | Freq: Three times a day (TID) | ORAL | 0 refills | Status: DC | PRN

## 2022-07-06 NOTE — Telephone Encounter (Signed)
Pended.

## 2022-07-27 NOTE — Patient Instructions (Signed)
DUE TO COVID-19 ONLY TWO VISITORS  (aged 77 and older)  ARE ALLOWED TO COME WITH YOU AND STAY IN THE WAITING ROOM ONLY DURING PRE OP AND PROCEDURE.   **NO VISITORS ARE ALLOWED IN THE SHORT STAY AREA OR RECOVERY ROOM!!**  IF YOU WILL BE ADMITTED INTO THE HOSPITAL YOU ARE ALLOWED ONLY FOUR SUPPORT PEOPLE DURING VISITATION HOURS ONLY (7 AM -8PM)   The support person(s) must pass our screening, gel in and out, and wear a mask at all times, including in the patient's room. Patients must also wear a mask when staff or their support person are in the room. Visitors GUEST BADGE MUST BE WORN VISIBLY  One adult visitor may remain with you overnight and MUST be in the room by 8 P.M.     Your procedure is scheduled on: 08/09/22   Report to [facility] Main Entrance    Report to admitting at 1:15 PM   Call this number if you have problems the morning of surgery [phone]   Do not eat food :After Midnight.   After Midnight you may have the following liquids until ____12:45 PM DAY OF SURGERY  Water Black Coffee (sugar ok, NO MILK/CREAM OR CREAMERS)  Tea (sugar ok, NO MILK/CREAM OR CREAMERS) regular and decaf                             Plain Jell-O (NO RED)                                           Fruit ices (not with fruit pulp, NO RED)                                     Popsicles (NO RED)                                                                  Juice: apple, WHITE grape, WHITE cranberry Sports drinks like Gatorade (NO RED)                   The day of surgery:  Drink ONE (1) Pre-Surgery Clear Ensure  at 12:30 PM the morning of surgery. Drink in one sitting. Do not sip.  This drink was given to you during your hospital  pre-op appointment visit. Nothing else to drink after completing the  Pre-Surgery Clear Ensure at 12:45 PM          If you have questions, please contact your surgeon's office.       Oral Hygiene is also important to reduce your risk of infection.                                     Remember - BRUSH YOUR TEETH THE MORNING OF SURGERY WITH YOUR REGULAR TOOTHPASTE   Do NOT smoke after Midnight   Take these medicines the morning of surgery with A SIP OF WATER: Venlafaxine-Effexor  Atrovastatin- Lipitor                                                                                                                             Omeprazolol- Prilosec                                                                                                                              Myrbetriq                                                                                                                              Atenolol- Tenormin  Bring CPAP mask and tubing day of surgery.                              You may not have any metal on your body including hair pins, jewelry, and body piercing             Do not wear make-up, lotions, powders, perfumes/cologne, or deodorant  Do not wear nail polish including gel and S&S, artificial/acrylic nails, or any other type of covering on natural nails including finger and toenails. If you have artificial nails, gel coating, etc. that needs to be removed by a nail salon please have this removed prior to surgery or surgery may need to be canceled/ delayed if the surgeon/ anesthesia feels like they are unable to be safely monitored.   Do not shave  48 hours prior to surgery.     Do not bring valuables to the hospital. [facility] IS NOT             RESPONSIBLE   FOR VALUABLES.   Contacts, dentures or bridgework may not be worn into surgery.   Bring small overnight bag day of surgery.   DO NOT BRING YOUR HOME MEDICATIONS TO THE  HOSPITAL. PHARMACY WILL DISPENSE MEDICATIONS LISTED ON YOUR MEDICATION LIST TO YOU DURING YOUR ADMISSION IN THE HOSPITAL!       Special Instructions: Bring a copy  of your healthcare power of attorney and living will documents the day of surgery if you haven't scanned them before.              Please read over the following fact sheets you were given: IF YOU HAVE QUESTIONS ABOUT YOUR PRE-OP INSTRUCTIONS PLEASE CALL [phone]     [facility] - Preparing for Surgery Before surgery, you can play an important role.  Because skin is not sterile, your skin needs to be as free of germs as possible.  You can reduce the number of germs on your skin by washing with CHG (chlorahexidine gluconate) soap before surgery.  CHG is an antiseptic cleaner which kills germs and bonds with the skin to continue killing germs even after washing. Please DO NOT use if you have an allergy to CHG or antibacterial soaps.  If your skin becomes reddened/irritated stop using the CHG and inform your nurse when you arrive at Short Stay. Do not shave (including legs and underarms) for at least 48 hours prior to the first CHG shower.   Please follow these instructions carefully:  1.  Shower with CHG Soap the night before surgery and the  morning of Surgery.  2.  If you choose to wash your hair, wash your hair first as usual with your  normal  shampoo.  3.  After you shampoo, rinse your hair and body thoroughly to remove the  shampoo.                            4.  Use CHG as you would any other liquid soap.  You can apply chg directly  to the skin and wash                       Gently with a scrungie or clean washcloth.  5.  Apply the CHG Soap to your body ONLY FROM THE NECK DOWN.   Do not use on face/ open                           Wound or open sores. Avoid contact with eyes, ears mouth and genitals (private parts).                       Wash face,  Genitals (private parts) with your normal soap.             6.  Wash thoroughly, paying special attention to the area where your surgery  will be performed.  7.  Thoroughly rinse your body with warm water from the neck down.  8.  DO NOT  shower/wash with your normal soap after using and rinsing off  the CHG Soap.                9.  Pat yourself dry with a clean towel.            10.  Wear clean pajamas.            11.  Place clean sheets on your bed the night of your first shower and do not  sleep with pets. Day of Surgery : Do not apply any lotions/deodorants the morning of surgery.  Please wear  clean clothes to the hospital/surgery center.  FAILURE TO FOLLOW THESE INSTRUCTIONS MAY RESULT IN THE CANCELLATION OF YOUR SURGERY   ________________________________________________________________________   Incentive Spirometer  An incentive spirometer is a tool that can help keep your lungs clear and active. This tool measures how well you are filling your lungs with each breath. Taking long deep breaths may help reverse or decrease the chance of developing breathing (pulmonary) problems (especially infection) following: A long period of time when you are unable to move or be active. BEFORE THE PROCEDURE  If the spirometer includes an indicator to show your best effort, your nurse or respiratory therapist will set it to a desired goal. If possible, sit up straight or lean slightly forward. Try not to slouch. Hold the incentive spirometer in an upright position. INSTRUCTIONS FOR USE  Sit on the edge of your bed if possible, or sit up as far as you can in bed or on a chair. Hold the incentive spirometer in an upright position. Breathe out normally. Place the mouthpiece in your mouth and seal your lips tightly around it. Breathe in slowly and as deeply as possible, raising the piston or the ball toward the top of the column. Hold your breath for 3-5 seconds or for as long as possible. Allow the piston or ball to fall to the bottom of the column. Remove the mouthpiece from your mouth and breathe out normally. Rest for a few seconds and repeat Steps 1 through 7 at least 10 times every 1-2 hours when you are awake. Take your time and  take a few normal breaths between deep breaths. The spirometer may include an indicator to show your best effort. Use the indicator as a goal to work toward during each repetition. After each set of 10 deep breaths, practice coughing to be sure your lungs are clear. If you have an incision (the cut made at the time of surgery), support your incision when coughing by placing a pillow or rolled up towels firmly against it. Once you are able to get out of bed, walk around indoors and cough well. You may stop using the incentive spirometer when instructed by your caregiver.  RISKS AND COMPLICATIONS Take your time so you do not get dizzy or light-headed. If you are in pain, you may need to take or ask for pain medication before doing incentive spirometry. It is harder to take a deep breath if you are having pain. AFTER USE Rest and breathe slowly and easily. It can be helpful to keep track of a log of your progress. Your caregiver can provide you with a simple table to help with this. If you are using the spirometer at home, follow these instructions: [place] IF:  You are having difficultly using the spirometer. You have trouble using the spirometer as often as instructed. Your pain medication is not giving enough relief while using the spirometer. You develop fever of 100.5 F (38.1 C) or higher. SEEK IMMEDIATE MEDICAL CARE IF:  You cough up bloody sputum that had not been present before. You develop fever of 102 F (38.9 C) or greater. You develop worsening pain at or near the incision site. MAKE SURE YOU:  Understand these instructions. Will watch your condition. Will get help right away if you are not doing well or get worse. Document Released: 02/20/2007 Document Revised: 01/02/2012 Document Reviewed: 04/23/2007 [facility] Patient Information 2014 [place], [place].   ________________________________________________________________________

## 2022-07-28 ENCOUNTER — Encounter (HOSPITAL_COMMUNITY)
Admission: RE | Admit: 2022-07-28 | Discharge: 2022-07-28 | Disposition: A | Discharge status: Home / Self Care | Payer: Medicare Other | Source: Ambulatory Visit | Attending: Orthopedic Surgery | Admitting: Orthopedic Surgery

## 2022-07-28 ENCOUNTER — Other Ambulatory Visit: Payer: Self-pay

## 2022-07-28 ENCOUNTER — Encounter (HOSPITAL_COMMUNITY): Payer: Self-pay

## 2022-07-28 DIAGNOSIS — Z01818 Encounter for other preprocedural examination: Secondary | ICD-10-CM | POA: Insufficient documentation

## 2022-07-28 DIAGNOSIS — I10 Essential (primary) hypertension: Secondary | ICD-10-CM | POA: Insufficient documentation

## 2022-07-28 DIAGNOSIS — M1711 Unilateral primary osteoarthritis, right knee: Secondary | ICD-10-CM | POA: Insufficient documentation

## 2022-07-28 HISTORY — DX: Gastro-esophageal reflux disease without esophagitis: K21.9

## 2022-07-28 HISTORY — DX: Depression, unspecified: F32.A

## 2022-07-28 HISTORY — DX: Hyperlipidemia, unspecified: E78.5

## 2022-07-28 HISTORY — DX: Anxiety disorder, unspecified: F41.9

## 2022-07-28 HISTORY — DX: Essential (primary) hypertension: I10

## 2022-07-28 LAB — CBC
HCT: 39.9 % (ref 36.0–46.0)
Hemoglobin: 12.6 g/dL (ref 12.0–15.0)
MCH: 32.7 pg (ref 26.0–34.0)
MCHC: 31.6 g/dL (ref 30.0–36.0)
MCV: 103.6 fL — ABNORMAL HIGH (ref 80.0–100.0)
Platelets: 203 10*3/uL (ref 150–400)
RBC: 3.85 MIL/uL — ABNORMAL LOW (ref 3.87–5.11)
RDW: 12.8 % (ref 11.5–15.5)
WBC: 7 10*3/uL (ref 4.0–10.5)
nRBC: 0 % (ref 0.0–0.2)

## 2022-07-28 LAB — SURGICAL PCR SCREEN
MRSA, PCR: NEGATIVE
Staphylococcus aureus: NEGATIVE

## 2022-07-28 LAB — TYPE AND SCREEN
ABO/RH(D): O POS
Antibody Screen: NEGATIVE

## 2022-07-28 LAB — BASIC METABOLIC PANEL
Anion gap: 7 (ref 5–15)
BUN: 17 mg/dL (ref 8–23)
CO2: 26 mmol/L (ref 22–32)
Calcium: 9.5 mg/dL (ref 8.9–10.3)
Chloride: 105 mmol/L (ref 98–111)
Creatinine, Ser: 1.05 mg/dL — ABNORMAL HIGH (ref 0.44–1.00)
GFR, Estimated: 55 mL/min — ABNORMAL LOW (ref >60)
Glucose, Bld: 87 mg/dL (ref 70–99)
Potassium: 4.6 mmol/L (ref 3.5–5.1)
Sodium: 138 mmol/L (ref 135–145)
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
## 2022-08-05 ENCOUNTER — Other Ambulatory Visit: Payer: Self-pay | Admitting: Psychiatry

## 2022-08-05 DIAGNOSIS — F331 Major depressive disorder, recurrent, moderate: Secondary | ICD-10-CM

## 2022-08-05 DIAGNOSIS — F411 Generalized anxiety disorder: Secondary | ICD-10-CM

## 2022-08-08 NOTE — Anesthesia Preprocedure Evaluation (Signed)
Anesthesia Evaluation  Patient identified by MRN, date of birth, ID band Patient awake    Reviewed: Allergy & Precautions, NPO status , Patient's Chart, lab work & pertinent test results, reviewed documented beta blocker date and time   History of Anesthesia Complications Negative for: history of anesthetic complications  Airway Mallampati: II  TM Distance: >3 FB Neck ROM: Full    Dental no notable dental hx.    Pulmonary neg pulmonary ROS,    Pulmonary exam normal        Cardiovascular hypertension, Pt. on home beta blockers and Pt. on medications Normal cardiovascular exam     Neuro/Psych Anxiety Depression T11-S1 spinal fusion for scoliosis    GI/Hepatic Neg liver ROS, GERD  Medicated and Controlled,  Endo/Other  negative endocrine ROS  Renal/GU Renal InsufficiencyRenal disease (Cr 1.05)  negative genitourinary   Musculoskeletal negative musculoskeletal ROS (+)   Abdominal   Peds  Hematology negative hematology ROS (+)   Anesthesia Other Findings Day of surgery medications reviewed with patient.  Reproductive/Obstetrics negative OB ROS                          Anesthesia Physical Anesthesia Plan  ASA: 2  Anesthesia Plan: Spinal   Post-op Pain Management: Tylenol PO (pre-op)* and Regional block*   Induction:   PONV Risk Score and Plan: 3 and Treatment may vary due to age or medical condition, Ondansetron, Propofol infusion and Dexamethasone  Airway Management Planned: Natural Airway and Simple Face Mask  Additional Equipment: None  Intra-op Plan:   Post-operative Plan:   Informed Consent: I have reviewed the patients History and Physical, chart, labs and discussed the procedure including the risks, benefits and alternatives for the proposed anesthesia with the patient or authorized representative who has indicated his/her understanding and acceptance.       Plan  Discussed with: CRNA  Anesthesia Plan Comments:       Anesthesia Quick Evaluation

## 2022-08-09 ENCOUNTER — Observation Stay (HOSPITAL_COMMUNITY)
Admission: RE | Admit: 2022-08-09 | Discharge: 2022-08-11 | Disposition: A | Discharge status: Home / Self Care | Payer: Medicare Other | Attending: Orthopedic Surgery | Admitting: Orthopedic Surgery

## 2022-08-09 ENCOUNTER — Ambulatory Visit (HOSPITAL_COMMUNITY): Payer: Medicare Other | Admitting: Anesthesiology

## 2022-08-09 ENCOUNTER — Encounter (HOSPITAL_COMMUNITY): Payer: Self-pay | Admitting: Orthopedic Surgery

## 2022-08-09 ENCOUNTER — Other Ambulatory Visit: Payer: Self-pay

## 2022-08-09 ENCOUNTER — Encounter (HOSPITAL_COMMUNITY)
Admission: RE | Disposition: A | Discharge status: Home / Self Care | Payer: Self-pay | Source: Home / Self Care | Attending: Orthopedic Surgery

## 2022-08-09 ENCOUNTER — Ambulatory Visit (HOSPITAL_BASED_OUTPATIENT_CLINIC_OR_DEPARTMENT_OTHER): Payer: Medicare Other | Admitting: Anesthesiology

## 2022-08-09 DIAGNOSIS — Z79899 Other long term (current) drug therapy: Secondary | ICD-10-CM | POA: Insufficient documentation

## 2022-08-09 DIAGNOSIS — M25461 Effusion, right knee: Secondary | ICD-10-CM

## 2022-08-09 DIAGNOSIS — M1711 Unilateral primary osteoarthritis, right knee: Secondary | ICD-10-CM | POA: Diagnosis present

## 2022-08-09 DIAGNOSIS — M659 Synovitis and tenosynovitis, unspecified: Secondary | ICD-10-CM | POA: Diagnosis not present

## 2022-08-09 DIAGNOSIS — I10 Essential (primary) hypertension: Secondary | ICD-10-CM | POA: Diagnosis not present

## 2022-08-09 DIAGNOSIS — Z01818 Encounter for other preprocedural examination: Secondary | ICD-10-CM

## 2022-08-09 DIAGNOSIS — Z96651 Presence of right artificial knee joint: Secondary | ICD-10-CM

## 2022-08-09 DIAGNOSIS — M25761 Osteophyte, right knee: Secondary | ICD-10-CM | POA: Diagnosis not present

## 2022-08-09 HISTORY — PX: TOTAL KNEE ARTHROPLASTY: SHX125

## 2022-08-09 LAB — ABO/RH: ABO/RH(D): O POS

## 2022-08-09 SURGERY — ARTHROPLASTY, KNEE, TOTAL
Anesthesia: Spinal | Site: Knee | Laterality: Right

## 2022-08-09 MED ORDER — BISACODYL 10 MG RE SUPP: 10.0000 mg | Freq: Every day | RECTAL | Status: DC | PRN

## 2022-08-09 MED ORDER — ONDANSETRON HCL 4 MG/2ML IJ SOLN
INTRAMUSCULAR | Status: AC
  Filled 2022-08-09: qty 2

## 2022-08-09 MED ORDER — FENTANYL CITRATE PF 50 MCG/ML IJ SOSY
25.0000 ug | PREFILLED_SYRINGE | INTRAMUSCULAR | Status: DC | PRN
  Administered 2022-08-09: 50 ug via INTRAVENOUS

## 2022-08-09 MED ORDER — ACETAMINOPHEN 500 MG PO TABS
1000.0000 mg | ORAL_TABLET | Freq: Once | ORAL | Status: AC
  Administered 2022-08-09: 500 mg via ORAL
  Filled 2022-08-09: qty 2

## 2022-08-09 MED ORDER — METHOCARBAMOL 500 MG IVPB - SIMPLE MED
500.0000 mg | Freq: Four times a day (QID) | INTRAVENOUS | Status: DC | PRN
  Administered 2022-08-09: 500 mg via INTRAVENOUS

## 2022-08-09 MED ORDER — 0.9 % SODIUM CHLORIDE (POUR BTL) OPTIME
TOPICAL | Status: DC | PRN
  Administered 2022-08-09: 1000 mL

## 2022-08-09 MED ORDER — BUPIVACAINE-EPINEPHRINE (PF) 0.25% -1:200000 IJ SOLN
INTRAMUSCULAR | Status: AC
  Filled 2022-08-09: qty 30

## 2022-08-09 MED ORDER — MIRABEGRON ER 25 MG PO TB24
50.0000 mg | ORAL_TABLET | Freq: Every day | ORAL | Status: DC
  Administered 2022-08-10 – 2022-08-11 (×2): 50 mg via ORAL
  Filled 2022-08-09 (×2): qty 2

## 2022-08-09 MED ORDER — ACETAMINOPHEN 325 MG PO TABS: 325.0000 mg | ORAL_TABLET | Freq: Four times a day (QID) | ORAL | Status: DC | PRN

## 2022-08-09 MED ORDER — CEFAZOLIN SODIUM-DEXTROSE 2-4 GM/100ML-% IV SOLN
2.0000 g | Freq: Four times a day (QID) | INTRAVENOUS | Status: AC
  Administered 2022-08-09 (×2): 2 g via INTRAVENOUS
  Filled 2022-08-09 (×2): qty 100

## 2022-08-09 MED ORDER — POVIDONE-IODINE 10 % EX SWAB
2.0000 | Freq: Once | CUTANEOUS | Status: AC
  Administered 2022-08-09: 2 via TOPICAL

## 2022-08-09 MED ORDER — FESOTERODINE FUMARATE ER 4 MG PO TB24
4.0000 mg | ORAL_TABLET | Freq: Every day | ORAL | Status: DC
  Administered 2022-08-10 – 2022-08-11 (×2): 4 mg via ORAL
  Filled 2022-08-09 (×2): qty 1

## 2022-08-09 MED ORDER — CHLORHEXIDINE GLUCONATE 0.12 % MT SOLN
15.0000 mL | Freq: Once | OROMUCOSAL | Status: AC
  Administered 2022-08-09: 15 mL via OROMUCOSAL

## 2022-08-09 MED ORDER — OXYCODONE HCL 5 MG PO TABS: 5.0000 mg | ORAL_TABLET | Freq: Once | ORAL | Status: DC | PRN

## 2022-08-09 MED ORDER — ASPIRIN 81 MG PO CHEW
81.0000 mg | CHEWABLE_TABLET | Freq: Two times a day (BID) | ORAL | Status: DC
  Administered 2022-08-10 – 2022-08-11 (×3): 81 mg via ORAL
  Filled 2022-08-09 (×3): qty 1

## 2022-08-09 MED ORDER — FENTANYL CITRATE PF 50 MCG/ML IJ SOSY
PREFILLED_SYRINGE | INTRAMUSCULAR | Status: AC
  Administered 2022-08-09: 25 ug via INTRAVENOUS
  Filled 2022-08-09: qty 2

## 2022-08-09 MED ORDER — FENTANYL CITRATE PF 50 MCG/ML IJ SOSY
PREFILLED_SYRINGE | INTRAMUSCULAR | Status: AC
  Filled 2022-08-09: qty 1

## 2022-08-09 MED ORDER — HYDROMORPHONE HCL 1 MG/ML IJ SOLN
0.5000 mg | INTRAMUSCULAR | Status: DC | PRN
  Administered 2022-08-09: 1 mg via INTRAVENOUS
  Filled 2022-08-09: qty 1

## 2022-08-09 MED ORDER — SODIUM CHLORIDE (PF) 0.9 % IJ SOLN
INTRAMUSCULAR | Status: DC | PRN
  Administered 2022-08-09: 30 mL

## 2022-08-09 MED ORDER — ORAL CARE MOUTH RINSE: 15.0000 mL | Freq: Once | OROMUCOSAL | Status: AC

## 2022-08-09 MED ORDER — PROPOFOL 500 MG/50ML IV EMUL
INTRAVENOUS | Status: DC | PRN
  Administered 2022-08-09: 100 ug/kg/min via INTRAVENOUS

## 2022-08-09 MED ORDER — CLONIDINE HCL (ANALGESIA) 100 MCG/ML EP SOLN
EPIDURAL | Status: DC | PRN
  Administered 2022-08-09: 100 ug

## 2022-08-09 MED ORDER — PHENYLEPHRINE HCL-NACL 20-0.9 MG/250ML-% IV SOLN
INTRAVENOUS | Status: DC | PRN
  Administered 2022-08-09: 35 ug/min via INTRAVENOUS

## 2022-08-09 MED ORDER — TRAZODONE HCL 50 MG PO TABS
50.0000 mg | ORAL_TABLET | Freq: Every day | ORAL | Status: DC
  Administered 2022-08-09 – 2022-08-10 (×2): 50 mg via ORAL
  Filled 2022-08-09 (×2): qty 1

## 2022-08-09 MED ORDER — PANTOPRAZOLE SODIUM 40 MG PO TBEC
40.0000 mg | DELAYED_RELEASE_TABLET | Freq: Every day | ORAL | Status: DC
  Administered 2022-08-09 – 2022-08-11 (×3): 40 mg via ORAL
  Filled 2022-08-09 (×3): qty 1

## 2022-08-09 MED ORDER — DEXAMETHASONE SODIUM PHOSPHATE 10 MG/ML IJ SOLN
8.0000 mg | Freq: Once | INTRAMUSCULAR | Status: AC
  Administered 2022-08-09: 10 mg via INTRAVENOUS

## 2022-08-09 MED ORDER — BUPIVACAINE-EPINEPHRINE (PF) 0.5% -1:200000 IJ SOLN
INTRAMUSCULAR | Status: DC | PRN
  Administered 2022-08-09: 15 mL via PERINEURAL

## 2022-08-09 MED ORDER — POLYETHYLENE GLYCOL 3350 17 G PO PACK
17.0000 g | PACK | Freq: Every day | ORAL | Status: DC
  Administered 2022-08-09 – 2022-08-11 (×3): 17 g via ORAL
  Filled 2022-08-09 (×3): qty 1

## 2022-08-09 MED ORDER — DEXAMETHASONE SODIUM PHOSPHATE 10 MG/ML IJ SOLN
INTRAMUSCULAR | Status: AC
  Filled 2022-08-09: qty 1

## 2022-08-09 MED ORDER — PHENOL 1.4 % MT LIQD: 1.0000 | OROMUCOSAL | Status: DC | PRN

## 2022-08-09 MED ORDER — METOCLOPRAMIDE HCL 5 MG/ML IJ SOLN: 5.0000 mg | Freq: Three times a day (TID) | INTRAMUSCULAR | Status: DC | PRN

## 2022-08-09 MED ORDER — PROPOFOL 10 MG/ML IV BOLUS
INTRAVENOUS | Status: DC | PRN
  Administered 2022-08-09 (×2): 10 mg via INTRAVENOUS
  Administered 2022-08-09: 60 mg via INTRAVENOUS
  Administered 2022-08-09: 70 mg via INTRAVENOUS

## 2022-08-09 MED ORDER — OXYCODONE HCL 5 MG/5ML PO SOLN: 5.0000 mg | Freq: Once | ORAL | Status: DC | PRN

## 2022-08-09 MED ORDER — SENNA 8.6 MG PO TABS
2.0000 | ORAL_TABLET | Freq: Every day | ORAL | Status: DC
  Administered 2022-08-09 – 2022-08-10 (×2): 17.2 mg via ORAL
  Filled 2022-08-09 (×2): qty 2

## 2022-08-09 MED ORDER — STERILE WATER FOR IRRIGATION IR SOLN
Status: DC | PRN
  Administered 2022-08-09: 2000 mL

## 2022-08-09 MED ORDER — FERROUS SULFATE 325 (65 FE) MG PO TABS
325.0000 mg | ORAL_TABLET | Freq: Three times a day (TID) | ORAL | Status: DC
  Administered 2022-08-10 – 2022-08-11 (×4): 325 mg via ORAL
  Filled 2022-08-09 (×4): qty 1

## 2022-08-09 MED ORDER — ONDANSETRON HCL 4 MG/2ML IJ SOLN: 4.0000 mg | Freq: Four times a day (QID) | INTRAMUSCULAR | Status: DC | PRN

## 2022-08-09 MED ORDER — SODIUM CHLORIDE 0.9 % IV SOLN: INTRAVENOUS | Status: DC

## 2022-08-09 MED ORDER — FENTANYL CITRATE PF 50 MCG/ML IJ SOSY
50.0000 ug | PREFILLED_SYRINGE | INTRAMUSCULAR | Status: DC
  Administered 2022-08-09: 50 ug via INTRAVENOUS
  Filled 2022-08-09: qty 2

## 2022-08-09 MED ORDER — SODIUM CHLORIDE 0.9 % IR SOLN
Status: DC | PRN
  Administered 2022-08-09: 1000 mL

## 2022-08-09 MED ORDER — METHOCARBAMOL 500 MG IVPB - SIMPLE MED
INTRAVENOUS | Status: AC
  Filled 2022-08-09: qty 55

## 2022-08-09 MED ORDER — FENTANYL CITRATE (PF) 100 MCG/2ML IJ SOLN
INTRAMUSCULAR | Status: DC | PRN
  Administered 2022-08-09 (×2): 50 ug via INTRAVENOUS

## 2022-08-09 MED ORDER — METOCLOPRAMIDE HCL 5 MG PO TABS: 5.0000 mg | ORAL_TABLET | Freq: Three times a day (TID) | ORAL | Status: DC | PRN

## 2022-08-09 MED ORDER — CEFAZOLIN SODIUM-DEXTROSE 2-4 GM/100ML-% IV SOLN
2.0000 g | INTRAVENOUS | Status: AC
  Administered 2022-08-09: 2 g via INTRAVENOUS
  Filled 2022-08-09: qty 100

## 2022-08-09 MED ORDER — MENTHOL 3 MG MT LOZG: 1.0000 | LOZENGE | OROMUCOSAL | Status: DC | PRN

## 2022-08-09 MED ORDER — KETOROLAC TROMETHAMINE 30 MG/ML IJ SOLN
INTRAMUSCULAR | Status: DC | PRN
  Administered 2022-08-09: 30 mg

## 2022-08-09 MED ORDER — ONDANSETRON HCL 4 MG/2ML IJ SOLN
INTRAMUSCULAR | Status: DC | PRN
  Administered 2022-08-09: 4 mg via INTRAVENOUS

## 2022-08-09 MED ORDER — FENTANYL CITRATE (PF) 100 MCG/2ML IJ SOLN
INTRAMUSCULAR | Status: AC
  Filled 2022-08-09: qty 2

## 2022-08-09 MED ORDER — METHOCARBAMOL 500 MG PO TABS
500.0000 mg | ORAL_TABLET | Freq: Four times a day (QID) | ORAL | Status: DC | PRN
  Administered 2022-08-10 – 2022-08-11 (×2): 500 mg via ORAL
  Filled 2022-08-09 (×2): qty 1

## 2022-08-09 MED ORDER — ACETAMINOPHEN 500 MG PO TABS
1000.0000 mg | ORAL_TABLET | Freq: Four times a day (QID) | ORAL | Status: DC
  Administered 2022-08-09 – 2022-08-11 (×7): 1000 mg via ORAL
  Filled 2022-08-09 (×7): qty 2

## 2022-08-09 MED ORDER — HYDROMORPHONE HCL 2 MG PO TABS
1.0000 mg | ORAL_TABLET | ORAL | Status: DC | PRN
  Administered 2022-08-10 – 2022-08-11 (×2): 2 mg via ORAL
  Filled 2022-08-09 (×2): qty 1

## 2022-08-09 MED ORDER — LACTATED RINGERS IV SOLN: INTRAVENOUS | Status: DC

## 2022-08-09 MED ORDER — VENLAFAXINE HCL ER 150 MG PO CP24
150.0000 mg | ORAL_CAPSULE | Freq: Every day | ORAL | Status: DC
  Administered 2022-08-10 – 2022-08-11 (×2): 150 mg via ORAL
  Filled 2022-08-09 (×2): qty 1

## 2022-08-09 MED ORDER — ATORVASTATIN CALCIUM 20 MG PO TABS
20.0000 mg | ORAL_TABLET | Freq: Every morning | ORAL | Status: DC
  Administered 2022-08-10 – 2022-08-11 (×2): 20 mg via ORAL
  Filled 2022-08-09 (×2): qty 1

## 2022-08-09 MED ORDER — ATENOLOL 25 MG PO TABS
25.0000 mg | ORAL_TABLET | Freq: Every morning | ORAL | Status: DC
  Administered 2022-08-10 – 2022-08-11 (×2): 25 mg via ORAL
  Filled 2022-08-09 (×2): qty 1

## 2022-08-09 MED ORDER — LOSARTAN POTASSIUM 50 MG PO TABS
50.0000 mg | ORAL_TABLET | Freq: Every morning | ORAL | Status: DC
  Administered 2022-08-10 – 2022-08-11 (×2): 50 mg via ORAL
  Filled 2022-08-09 (×2): qty 1

## 2022-08-09 MED ORDER — ONDANSETRON HCL 4 MG PO TABS: 4.0000 mg | ORAL_TABLET | Freq: Four times a day (QID) | ORAL | Status: DC | PRN

## 2022-08-09 MED ORDER — TRANEXAMIC ACID-NACL 1000-0.7 MG/100ML-% IV SOLN
1000.0000 mg | INTRAVENOUS | Status: DC
  Filled 2022-08-09: qty 100

## 2022-08-09 MED ORDER — SODIUM CHLORIDE (PF) 0.9 % IJ SOLN
INTRAMUSCULAR | Status: AC
  Filled 2022-08-09: qty 50

## 2022-08-09 MED ORDER — KETOROLAC TROMETHAMINE 30 MG/ML IJ SOLN
INTRAMUSCULAR | Status: AC
  Filled 2022-08-09: qty 1

## 2022-08-09 MED ORDER — DEXAMETHASONE SODIUM PHOSPHATE 10 MG/ML IJ SOLN
10.0000 mg | Freq: Once | INTRAMUSCULAR | Status: AC
  Administered 2022-08-10: 10 mg via INTRAVENOUS
  Filled 2022-08-09: qty 1

## 2022-08-09 MED ORDER — BUPIVACAINE-EPINEPHRINE (PF) 0.25% -1:200000 IJ SOLN
INTRAMUSCULAR | Status: DC | PRN
  Administered 2022-08-09: 30 mL via PERINEURAL

## 2022-08-09 MED ORDER — BUPIVACAINE IN DEXTROSE 0.75-8.25 % IT SOLN
INTRATHECAL | Status: DC | PRN
  Administered 2022-08-09: 1.6 mL via INTRATHECAL

## 2022-08-09 MED ORDER — DIPHENHYDRAMINE HCL 12.5 MG/5ML PO ELIX: 12.5000 mg | ORAL_SOLUTION | ORAL | Status: DC | PRN

## 2022-08-09 MED ORDER — HYDROMORPHONE HCL 2 MG PO TABS
2.0000 mg | ORAL_TABLET | ORAL | Status: DC | PRN
  Administered 2022-08-10: 2 mg via ORAL
  Administered 2022-08-10: 3 mg via ORAL
  Filled 2022-08-09: qty 2
  Filled 2022-08-09: qty 1

## 2022-08-09 MED ORDER — TRANEXAMIC ACID-NACL 1000-0.7 MG/100ML-% IV SOLN
1000.0000 mg | Freq: Once | INTRAVENOUS | Status: AC
  Administered 2022-08-09: 1000 mg via INTRAVENOUS
  Filled 2022-08-09: qty 100

## 2022-08-09 SURGICAL SUPPLY — 50 items
ATTUNE MED ANAT PAT 35 KNEE (Knees) IMPLANT
ATTUNE PSFEM RTSZ6 NARCEM KNEE (Femur) IMPLANT
ATTUNE PSRP INSR SZ6 8 KNEE (Insert) IMPLANT
BAG COUNTER SPONGE SURGICOUNT (BAG) IMPLANT
BAG ZIPLOCK 12X15 (MISCELLANEOUS) IMPLANT
BASE TIBIAL ROT PLAT SZ 5 KNEE (Knees) IMPLANT
BLADE SAW SGTL 11.0X1.19X90.0M (BLADE) IMPLANT
BLADE SAW SGTL 13.0X1.19X90.0M (BLADE) ×1 IMPLANT
BNDG ELASTIC 6X10 VLCR STRL LF (GAUZE/BANDAGES/DRESSINGS) IMPLANT
BNDG ELASTIC 6X5.8 VLCR STR LF (GAUZE/BANDAGES/DRESSINGS) ×1 IMPLANT
BOWL SMART MIX CTS (DISPOSABLE) ×1 IMPLANT
CEMENT HV SMART SET (Cement) IMPLANT
CUFF TOURN SGL QUICK 34 (TOURNIQUET CUFF) ×1
CUFF TRNQT CYL 34X4.125X (TOURNIQUET CUFF) ×1 IMPLANT
DERMABOND ADVANCED .7 DNX12 (GAUZE/BANDAGES/DRESSINGS) ×1 IMPLANT
DRAPE U-SHAPE 47X51 STRL (DRAPES) ×1 IMPLANT
DRESSING AQUACEL AG SP 3.5X10 (GAUZE/BANDAGES/DRESSINGS) ×1 IMPLANT
DRSG AQUACEL AG SP 3.5X10 (GAUZE/BANDAGES/DRESSINGS) ×1
DURAPREP 26ML APPLICATOR (WOUND CARE) ×2 IMPLANT
ELECT REM PT RETURN 15FT ADLT (MISCELLANEOUS) ×1 IMPLANT
GLOVE BIO SURGEON STRL SZ 6 (GLOVE) ×1 IMPLANT
GLOVE BIOGEL PI IND STRL 6.5 (GLOVE) ×1 IMPLANT
GLOVE BIOGEL PI IND STRL 7.5 (GLOVE) ×1 IMPLANT
GLOVE ORTHO TXT STRL SZ7.5 (GLOVE) ×2 IMPLANT
GOWN STRL REUS W/ TWL LRG LVL3 (GOWN DISPOSABLE) ×2 IMPLANT
GOWN STRL REUS W/TWL LRG LVL3 (GOWN DISPOSABLE) ×2
HANDPIECE INTERPULSE COAX TIP (DISPOSABLE) ×1
HOLDER FOLEY CATH W/STRAP (MISCELLANEOUS) IMPLANT
KIT TURNOVER KIT A (KITS) IMPLANT
MANIFOLD NEPTUNE II (INSTRUMENTS) ×1 IMPLANT
NDL SAFETY ECLIP 18X1.5 (MISCELLANEOUS) IMPLANT
NS IRRIG 1000ML POUR BTL (IV SOLUTION) ×1 IMPLANT
PACK TOTAL KNEE CUSTOM (KITS) ×1 IMPLANT
PIN FIX SIGMA LCS THRD HI (PIN) IMPLANT
PROTECTOR NERVE ULNAR (MISCELLANEOUS) ×1 IMPLANT
SET HNDPC FAN SPRY TIP SCT (DISPOSABLE) ×1 IMPLANT
SET PAD KNEE POSITIONER (MISCELLANEOUS) ×1 IMPLANT
SPIKE FLUID TRANSFER (MISCELLANEOUS) ×2 IMPLANT
SUT MNCRL AB 4-0 PS2 18 (SUTURE) ×1 IMPLANT
SUT STRATAFIX PDS+ 0 24IN (SUTURE) ×1 IMPLANT
SUT VIC AB 1 CT1 36 (SUTURE) ×1 IMPLANT
SUT VIC AB 2-0 CT1 27 (SUTURE) ×2
SUT VIC AB 2-0 CT1 TAPERPNT 27 (SUTURE) ×2 IMPLANT
SYR 3ML LL SCALE MARK (SYRINGE) ×1 IMPLANT
TIBIAL BASE ROT PLAT SZ 5 KNEE (Knees) ×1 IMPLANT
TOWEL GREEN STERILE FF (TOWEL DISPOSABLE) ×1 IMPLANT
TRAY FOLEY MTR SLVR 16FR STAT (SET/KITS/TRAYS/PACK) ×1 IMPLANT
TUBE SUCTION HIGH CAP CLEAR NV (SUCTIONS) ×1 IMPLANT
WATER STERILE IRR 1000ML POUR (IV SOLUTION) ×2 IMPLANT
WRAP KNEE MAXI GEL POST OP (GAUZE/BANDAGES/DRESSINGS) ×1 IMPLANT

## 2022-08-09 NOTE — Anesthesia Procedure Notes (Signed)
Procedure Name: LMA Insertion Date/Time: 08/09/2022 11:06 AM  Performed by: Niel Hummer, CRNAPre-anesthesia Checklist: Patient identified, Emergency Drugs available, Suction available and Patient being monitored Patient Re-evaluated:Patient Re-evaluated prior to induction Oxygen Delivery Method: Circle system utilized Preoxygenation: Pre-oxygenation with 100% oxygen Induction Type: IV induction Ventilation: Mask ventilation without difficulty LMA: LMA inserted LMA Size: 4.0 Number of attempts: 1 Dental Injury: Teeth and Oropharynx as per pre-operative assessment

## 2022-08-09 NOTE — Interval H&P Note (Signed)
History and Physical Interval Note:  08/09/2022 9:06 AM  Catherine Haas  has presented today for surgery, with the diagnosis of Right knee osteoarthritis.  The various methods of treatment have been discussed with the patient and family. After consideration of risks, benefits and other options for treatment, the patient has consented to  Procedure(s): TOTAL KNEE ARTHROPLASTY (Right) as a surgical intervention.  The patient's history has been reviewed, patient examined, no change in status, stable for surgery.  I have reviewed the patient's chart and labs.  Questions were answered to the patient's satisfaction.     Mauri Pole

## 2022-08-09 NOTE — Evaluation (Signed)
Physical Therapy Evaluation Patient Details Name: Catherine Haas MRN: [id number] DOB: April 06, 1946 Today's Date: 08/09/2022  History of Present Illness  [name] is a 76 y.o. female patient s/p R TKA 08/09/22. PMH: anxiety, depresion, HTN  Clinical Impression  Pt is s/p R TKA resulting in the deficits listed below (see PT Problem List). Pt from home with spouse, son's family lives nextdoor, single level home with level entry or 2 steps to enter, using RW at baseline with multiple reported falls due to multiple reasons per pt. Pt with weak quad set, able to perform SLR with static ~15 degree bend. Pt comes to EOB with increased time, cues and therapist managing lines for safety. Once standing, pt with R knee buckling noted in standing with static marching, possibly effects of block haven't worn off yet. Assisted pt back to supine, min A to lift RLE back into bed and position to comfort. Notified RN of O2 removal and pt's pain and knee buckling with session. Plan to progress ambulation, complete stair training and plan to d/c home with family support. Pt will benefit from skilled PT to increase their independence and safety with mobility to allow discharge to the venue listed below.         Recommendations for follow up therapy are one component of a multi-disciplinary discharge planning process, led by the attending physician.  Recommendations may be updated based on patient status, additional functional criteria and insurance authorization.  Follow Up Recommendations Follow physician's recommendations for discharge plan and follow up therapies      Assistance Recommended at Discharge Frequent or constant Supervision/Assistance  Patient can return home with the following  A little help with walking and/or transfers;A little help with bathing/dressing/bathroom;Assistance with cooking/housework;Assist for transportation;Help with stairs or ramp for entrance    Equipment Recommendations None  recommended by PT  Recommendations for Other Services       Functional Status Assessment Patient has had a recent decline in their functional status and demonstrates the ability to make significant improvements in function in a reasonable and predictable amount of time.     Precautions / Restrictions Precautions Precautions: Fall;Knee Restrictions Weight Bearing Restrictions: No      Mobility  Bed Mobility Overal bed mobility: Needs Assistance Bed Mobility: Supine to Sit, Sit to Supine  Supine to sit: Min guard Sit to supine: Min assist  General bed mobility comments: min guard to mobilize to EOB, therapist managing lines for safety, min A to lift RLE back into bed    Transfers Overall transfer level: Needs assistance Equipment used: Rolling walker (2 wheels) Transfers: Sit to/from Stand Sit to Stand: Mod assist  General transfer comment: mod A to power to stand and steady, slow to rise up, cues for hand placement    Ambulation/Gait  General Gait Details: attempted standing marching with pt able to marching RLE off of floor, R knee buckling noted when attempting to march L foot off of floor, pt c/o plantar surface numbnes bilaterally, further ambulation deferred  Stairs            Wheelchair Mobility    Modified Rankin (Stroke Patients Only)       Balance Overall balance assessment: Needs assistance, History of Falls Sitting-balance support: Feet supported Sitting balance-Leahy Scale: Good  Standing balance support: During functional activity, Bilateral upper extremity supported, Reliant on assistive device for balance Standing balance-Leahy Scale: Poor       Pertinent Vitals/Pain Pain Assessment Pain Assessment: Faces Faces Pain Scale: Hurts even  more Pain Location: R knee with mobility Pain Descriptors / Indicators: Aching, Sore Pain Intervention(s): Limited activity within patient's tolerance, Monitored during session, Repositioned, Ice applied,  Patient requesting pain meds-RN notified    Home Living Family/patient expects to be discharged to:: Private residence Living Arrangements: Spouse/significant other Available Help at Discharge: Family;Available 24 hours/day Type of Home: House Home Access: Level entry;Stairs to enter Entrance Stairs-Rails: None Entrance Stairs-Number of Steps: 2 steps without handrail at front door; garage is level entry   Home Layout: One level Home Equipment: Conservation officer, nature (2 wheels);Shower seat Additional Comments: pt reports success with OPPT for neck, back and knee pain    Prior Function Prior Level of Function : Independent/Modified Independent  Mobility Comments: pt reports using RW for community ambulation, reports multiple falls and medical conditions limiting her ADLs Comments: pt reports ind with RW and increased time     Hand Dominance        Extremity/Trunk Assessment   Upper Extremity Assessment Upper Extremity Assessment: Overall WFL for tasks assessed    Lower Extremity Assessment Lower Extremity Assessment: RLE deficits/detail RLE Deficits / Details: ankle AROM WFL, knee ~ -15 to 90, weak quad set, SLR with static ~15 degree bend; numbness complaints to plantar surface of foot    Cervical / Trunk Assessment Cervical / Trunk Assessment: Normal  Communication   Communication: No difficulties  Cognition Arousal/Alertness: Awake/alert Behavior During Therapy: WFL for tasks assessed/performed Overall Cognitive Status: Within Functional Limits for tasks assessed     General Comments General comments (skin integrity, edema, etc.): on RA with SpO2 100%    Exercises Total Joint Exercises Ankle Circles/Pumps: Supine, AROM, Both, 10 reps   Assessment/Plan    PT Assessment Patient needs continued PT services  PT Problem List Decreased strength;Decreased range of motion;Decreased activity tolerance;Decreased balance;Decreased mobility;Decreased knowledge of use of  DME;Decreased safety awareness;Impaired sensation;Pain       PT Treatment Interventions DME instruction;Gait training;Stair training;Functional mobility training;Therapeutic activities;Therapeutic exercise;Balance training;Patient/family education;Modalities    PT Goals (Current goals can be found in the Care Plan section)  Acute Rehab PT Goals Patient Stated Goal: return home with family support PT Goal Formulation: With patient Time For Goal Achievement: 08/23/22 Potential to Achieve Goals: Good    Frequency 7X/week     Co-evaluation               AM-PAC PT "6 Clicks" Mobility  Outcome Measure Help needed turning from your back to your side while in a flat bed without using bedrails?: A Little Help needed moving from lying on your back to sitting on the side of a flat bed without using bedrails?: A Little Help needed moving to and from a bed to a chair (including a wheelchair)?: A Little Help needed standing up from a chair using your arms (e.g., wheelchair or bedside chair)?: A Little Help needed to walk in hospital room?: Total Help needed climbing 3-5 steps with a railing? : Total 6 Click Score: 14    End of Session Equipment Utilized During Treatment: Gait belt Activity Tolerance: Patient tolerated treatment well Patient left: in bed;with call bell/phone within reach;with family/visitor present Nurse Communication: Mobility status;Other (comment) (removed O2, SpO2 100% on RA) PT Visit Diagnosis: Unsteadiness on feet (R26.81);Other abnormalities of gait and mobility (R26.89);Muscle weakness (generalized) (M62.81);Pain Pain - Right/Left: Right Pain - part of body: Knee    Time: 3329-5188 PT Time Calculation (min) (ACUTE ONLY): 29 min   Charges:   PT Evaluation $PT Eval Low  Complexity: 1 Low PT Treatments $Therapeutic Activity: 8-22 mins         Tori Odis Wickey PT, DPT 08/09/22, 4:12 PM

## 2022-08-09 NOTE — Transfer of Care (Signed)
Immediate Anesthesia Transfer of Care Note  Patient: Floreine Kingdon  Procedure(s) Performed: TOTAL KNEE ARTHROPLASTY (Right: Knee)  Patient Location: PACU  Anesthesia Type:General  Level of Consciousness: awake, alert  and oriented  Airway & Oxygen Therapy: Patient Spontanous Breathing and Patient connected to face mask oxygen  Post-op Assessment: Report given to RN and Post -op Vital signs reviewed and stable  Post vital signs: Reviewed and stable  Last Vitals:  Vitals Value Taken Time  BP 133/82 08/09/22 1219  Temp    Pulse 60 08/09/22 1222  Resp 20 08/09/22 1222  SpO2 97 % 08/09/22 1222  Vitals shown include unvalidated device data.  Last Pain:  Vitals:   08/09/22 0746  TempSrc:   PainSc: 4       Patients Stated Pain Goal: 3 (16/10/96 0454)  Complications: No notable events documented.

## 2022-08-09 NOTE — Anesthesia Procedure Notes (Signed)
Anesthesia Regional Block: Adductor canal block   Pre-Anesthetic Checklist: , timeout performed,  Correct Patient, Correct Site, Correct Laterality,  Correct Procedure, Correct Position, site marked,  Risks and benefits discussed,  Pre-op evaluation,  At surgeon's request and post-op pain management  Laterality: Right  Prep: Maximum Sterile Barrier Precautions used, chloraprep       Needles:  Injection technique: Single-shot  Needle Type: Echogenic Stimulator Needle     Needle Length: 9cm  Needle Gauge: 22     Additional Needles:   Procedures:,,,, ultrasound used (permanent image in chart),,    Narrative:  Start time: 08/09/2022 9:50 AM End time: 08/09/2022 9:53 AM Injection made incrementally with aspirations every 5 mL.  Performed by: Personally  Anesthesiologist: Brennan Bailey, MD  Additional Notes: Risks, benefits, and alternative discussed. Patient gave consent for procedure. Patient prepped and draped in sterile fashion. Sedation administered, patient remains easily responsive to voice. Relevant anatomy identified with ultrasound guidance. Local anesthetic given in 5cc increments with no signs or symptoms of intravascular injection. No pain or paraesthesias with injection. Patient monitored throughout procedure with signs of LAST or immediate complications. Tolerated well. Ultrasound image placed in chart.  Tawny Asal, MD

## 2022-08-09 NOTE — Op Note (Signed)
NAME:  Catherine Haas                      MEDICAL RECORD NO.:  [id number]                             FACILITY:  [facility]      PHYSICIAN:  Madlyn Frankel. Charlann Boxer, M.D.  DATE OF BIRTH:  08-Dec-1945      DATE OF PROCEDURE:  08/09/2022                                     OPERATIVE REPORT         PREOPERATIVE DIAGNOSIS:  Right knee osteoarthritis.      POSTOPERATIVE DIAGNOSIS:  Right knee osteoarthritis.      FINDINGS:  The patient was noted to have complete loss of cartilage and   bone-on-bone arthritis with associated osteophytes in the medial and patellofemoral compartments of   the knee with a significant synovitis and associated effusion.  The patient had failed months of conservative treatment including medications, injection therapy, activity modification.     PROCEDURE:  Right total knee replacement.      COMPONENTS USED:  DePuy Attune rotating platform posterior stabilized knee   system, a size 6N femur, 5 tibia, size 8 mm PS AOX insert, and 35 anatomic patellar   button.      SURGEON:  Madlyn Frankel. Charlann Boxer, M.D.      ASSISTANT:  Rosalene Billings, PA-C.      ANESTHESIA:  Regional and Spinal.      SPECIMENS:  None.      COMPLICATION:  None.      DRAINS:  None.  EBL: <100 cc      TOURNIQUET TIME:  30 min at 225 mmHg     The patient was stable to the recovery room.      INDICATION FOR PROCEDURE:  Catherine Haas is a 76 y.o. female patient of   mine.  The patient had been seen, evaluated, and treated for months conservatively in the   office with medication, activity modification, and injections.  The patient had   radiographic changes of bone-on-bone arthritis with endplate sclerosis and osteophytes noted.  Based on the radiographic changes and failed conservative measures, the patient   decided to proceed with definitive treatment, total knee replacement.  Risks of infection, DVT, component failure, need for revision surgery, neurovascular injury were reviewed in the office  setting.  The postop course was reviewed stressing the efforts to maximize post-operative satisfaction and function.  Consent was obtained for benefit of pain   relief.      PROCEDURE IN DETAIL:  The patient was brought to the operative theater.   Once adequate anesthesia, preoperative antibiotics, 2 gm of Ancef,1 gm of Tranexamic Acid, and 10 mg of Decadron administered, the patient was positioned supine with a right thigh tourniquet placed.  The  right lower extremity was prepped and draped in sterile fashion.  A time-   out was performed identifying the patient, planned procedure, and the appropriate extremity.      The right lower extremity was placed in the [facility] leg holder.  The leg was   exsanguinated, tourniquet elevated to 250 mmHg.  A midline incision was   made followed by median parapatellar arthrotomy.  Following initial   exposure, attention was first directed  to the patella.  Precut   measurement was noted to be 22 mm.  I resected down to 13 mm and used a   35 anatomic patellar button to restore patellar height as well as cover the cut surface.      The lug holes were drilled and a metal shim was placed to protect the   patella from retractors and saw blade during the procedure.      At this point, attention was now directed to the femur.  The femoral   canal was opened with a drill, irrigated to try to prevent fat emboli.  An   intramedullary rod was passed at 3 degrees valgus, 9 mm of bone was   resected off the distal femur.  Following this resection, the tibia was   subluxated anteriorly.  Using the extramedullary guide, 2 mm of bone was resected off   the proximal medial tibia.  We confirmed the gap would be   stable medially and laterally with a size 5 spacer block as well as confirmed that the tibial cut was perpendicular in the coronal plane, checking with an alignment rod.      Once this was done, I sized the femur to be a size 6 in the anterior-   posterior  dimension, chose a narrow component based on medial and   lateral dimension.  The size 6 rotation block was then pinned in   position anterior referenced using the C-clamp to set rotation.  The   anterior, posterior, and  chamfer cuts were made without difficulty nor   notching making certain that I was along the anterior cortex to help   with flexion gap stability.      The final box cut was made off the lateral aspect of distal femur.      At this point, the tibia was sized to be a size 5.  The size 5 tray was   then pinned in position through the medial third of the tubercle,   drilled, and keel punched.  Trial reduction was now carried with a 6 femur,  5 tibia, a size 8 mm PS insert, and the 35 anatomic patella botton.  The knee was brought to full extension with good flexion stability with the patella   tracking through the trochlea without application of pressure.  Given   all these findings the trial components removed.  Final components were   opened and cement was mixed.  The knee was irrigated with normal saline solution and pulse lavage.  The synovial lining was   then injected with 30 cc of 0.25% Marcaine with epinephrine, 1 cc of Toradol and 30 cc of NS for a total of 61 cc.     Final implants were then cemented onto cleaned and dried cut surfaces of bone with the knee brought to extension with a size 8 mm PS trial insert.      Once the cement had fully cured, excess cement was removed   throughout the knee.  I confirmed that I was satisfied with the range of   motion and stability, and the final size 8 mm PS AOX insert was chosen.  It was   placed into the knee.      The tourniquet had been let down at 30 minutes.  No significant   hemostasis was required.  The extensor mechanism was then reapproximated using #1 Vicryl and #1 Stratafix sutures with the knee   in flexion.  The   remaining  wound was closed with 2-0 Vicryl and running 4-0 Monocryl.   The knee was cleaned,  dried, dressed sterilely using Dermabond and   Aquacel dressing.  The patient was then   brought to recovery room in stable condition, tolerating the procedure   well.   Please note that Physician Assistant, Rosalene Billings, PA-C was present for the entirety of the case, and was utilized for pre-operative positioning, peri-operative retractor management, general facilitation of the procedure and for primary wound closure at the end of the case.              Madlyn Frankel Charlann Boxer, M.D.    08/09/2022 10:41 AM

## 2022-08-09 NOTE — Plan of Care (Signed)
  Problem: Education: Goal: Knowledge of the prescribed therapeutic regimen will improve Outcome: Progressing   Problem: Pain Management: Goal: Pain level will decrease with appropriate interventions Outcome: Progressing   Problem: Activity: Goal: Risk for activity intolerance will decrease Outcome: Progressing   

## 2022-08-09 NOTE — Anesthesia Procedure Notes (Signed)
Spinal  Patient location during procedure: OR Start time: 08/09/2022 10:31 AM End time: 08/09/2022 10:37 AM Reason for block: surgical anesthesia Staffing Performed: anesthesiologist  Anesthesiologist: Brennan Bailey, MD Performed by: Brennan Bailey, MD Authorized by: Brennan Bailey, MD   Preanesthetic Checklist Completed: patient identified, IV checked, risks and benefits discussed, surgical consent, monitors and equipment checked, pre-op evaluation and timeout performed Spinal Block Patient position: sitting Prep: DuraPrep and site prepped and draped Patient monitoring: continuous pulse ox, blood pressure and heart rate Approach: midline Location: L3-4 Injection technique: single-shot Needle Needle type: Whitacre  Needle gauge: 22 G Needle length: 9 cm Assessment Events: CSF return Additional Notes Risks, benefits, and alternative discussed. Patient gave consent to procedure. Prepped and draped in sitting position. Patient sedated but responsive to voice. Difficult spinal due to previous spinal hardware T11-S1. First attempt x2 at L3-4 unsuccessful. One attempt at L4-5 with clear CSF obtained. Positive terminal aspiration. No pain or paraesthesias with injection. Patient tolerated procedure well. Vital signs stable. Tawny Asal, MD

## 2022-08-09 NOTE — Addendum Note (Signed)
Addendum  created 08/09/22 1343 by Brennan Bailey, MD   Clinical Note Signed

## 2022-08-09 NOTE — Anesthesia Postprocedure Evaluation (Addendum)
Anesthesia Post Note  Patient: Catherine Haas  Procedure(s) Performed: TOTAL KNEE ARTHROPLASTY (Right: Knee)     Patient location during evaluation: PACU Anesthesia Type: General Level of consciousness: awake and alert Pain management: pain level controlled Vital Signs Assessment: post-procedure vital signs reviewed and stable Respiratory status: spontaneous breathing, nonlabored ventilation and respiratory function stable Cardiovascular status: blood pressure returned to baseline Postop Assessment: no apparent nausea or vomiting, spinal receding, no headache and no backache Anesthetic complications: no   No notable events documented.  Last Vitals:  Vitals:   08/09/22 1315 08/09/22 1330  BP: 117/62 110/63  Pulse: 85 72  Resp: 12 14  Temp:    SpO2: 95% 98%    Last Pain:  Vitals:   08/09/22 1315  TempSrc:   PainSc: Asleep                 Marthenia Rolling

## 2022-08-09 NOTE — Discharge Instructions (Signed)

## 2022-08-09 NOTE — Anesthesia Procedure Notes (Signed)
Procedure Name: MAC Date/Time: 08/09/2022 10:25 AM  Performed by: Maxwell Caul, CRNAPre-anesthesia Checklist: Patient identified, Emergency Drugs available, Suction available and Patient being monitored Oxygen Delivery Method: Simple face mask
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
## 2022-08-10 ENCOUNTER — Encounter (HOSPITAL_COMMUNITY): Payer: Self-pay | Admitting: Orthopedic Surgery

## 2022-08-10 DIAGNOSIS — M1711 Unilateral primary osteoarthritis, right knee: Secondary | ICD-10-CM | POA: Diagnosis not present

## 2022-08-10 LAB — BASIC METABOLIC PANEL
Anion gap: 6 (ref 5–15)
BUN: 17 mg/dL (ref 8–23)
CO2: 23 mmol/L (ref 22–32)
Calcium: 8.2 mg/dL — ABNORMAL LOW (ref 8.9–10.3)
Chloride: 108 mmol/L (ref 98–111)
Creatinine, Ser: 1.04 mg/dL — ABNORMAL HIGH (ref 0.44–1.00)
GFR, Estimated: 55 mL/min — ABNORMAL LOW (ref >60)
Glucose, Bld: 106 mg/dL — ABNORMAL HIGH (ref 70–99)
Potassium: 4.7 mmol/L (ref 3.5–5.1)
Sodium: 137 mmol/L (ref 135–145)

## 2022-08-10 LAB — CBC
HCT: 31.5 % — ABNORMAL LOW (ref 36.0–46.0)
Hemoglobin: 9.7 g/dL — ABNORMAL LOW (ref 12.0–15.0)
MCH: 32.6 pg (ref 26.0–34.0)
MCHC: 30.8 g/dL (ref 30.0–36.0)
MCV: 105.7 fL — ABNORMAL HIGH (ref 80.0–100.0)
Platelets: 187 10*3/uL (ref 150–400)
RBC: 2.98 MIL/uL — ABNORMAL LOW (ref 3.87–5.11)
RDW: 12.9 % (ref 11.5–15.5)
WBC: 9.1 10*3/uL (ref 4.0–10.5)
nRBC: 0 % (ref 0.0–0.2)

## 2022-08-10 MED ORDER — HYDROMORPHONE HCL 2 MG PO TABS: 2.0000 mg | ORAL_TABLET | ORAL | 0 refills | Status: AC | PRN

## 2022-08-10 MED ORDER — POLYETHYLENE GLYCOL 3350 17 G PO PACK: 17.0000 g | PACK | Freq: Two times a day (BID) | ORAL | 0 refills | Status: AC

## 2022-08-10 MED ORDER — SENNA 8.6 MG PO TABS: 2.0000 | ORAL_TABLET | Freq: Every day | ORAL | 0 refills | Status: AC

## 2022-08-10 MED ORDER — METHOCARBAMOL 500 MG PO TABS: 500.0000 mg | ORAL_TABLET | Freq: Four times a day (QID) | ORAL | 2 refills | Status: AC | PRN

## 2022-08-10 MED ORDER — ACETAMINOPHEN 500 MG PO TABS: 1000.0000 mg | ORAL_TABLET | Freq: Four times a day (QID) | ORAL | 0 refills | Status: AC

## 2022-08-10 MED ORDER — ASPIRIN 81 MG PO CHEW: 81.0000 mg | CHEWABLE_TABLET | Freq: Two times a day (BID) | ORAL | 0 refills | Status: AC

## 2022-08-10 NOTE — Plan of Care (Signed)
  Problem: Education: Goal: Knowledge of the prescribed therapeutic regimen will improve Outcome: Progressing   

## 2022-08-10 NOTE — Progress Notes (Signed)
Notified Theresa Duty, PA that patient did not pass PT. Discharge date modified to 08/11/22. Will continue to monitor patient.

## 2022-08-10 NOTE — TOC Transition Note (Signed)
Transition of Care [facility]) - CM/SW Discharge Note   Patient Details  Name: Catherine Haas MRN: [id number] Date of Birth: [birth year]  [facility] CM/SW Contact:  Lennart Pall, LCSW Phone Number: 08/10/2022, 10:14 AM   Clinical Narrative:    Met with pt and confirming she has all needed DME at home.  OPPT already arranged with [facility].  No TOC needs.   Final next level of care: OP Rehab Barriers to Discharge: No Barriers Identified   Patient Goals and CMS Choice Patient states their goals for this hospitalization and ongoing recovery are:: return home      Discharge Placement                       Discharge Plan and Services                DME Arranged: N/A DME Agency: NA                  Social Determinants of Health (SDOH) Interventions Housing Interventions: Patient Refused   Readmission Risk Interventions     No data to display
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
## 2022-08-10 NOTE — Progress Notes (Signed)
Physical Therapy Treatment Patient Details Name: Catherine Haas MRN: [id number] DOB: 1945/12/24 Today's Date: 08/10/2022   History of Present Illness Catherine Haas is a 76 y.o. female patient s/p R TKA 08/09/22. PMH: anxiety, depresion, HTN    PT Comments    Pt improving with skilled PT interventions, ambulates 140 ft with RW and min guard, 2 minor R knee buckling instances so cued pt to return to step-to pattern and provided increased cues for quad activation in stance. Pt tolerates strengthening exercises, only reporting pain with extension. Pt verbalizes fearful of ambulating without KI and fearful of falling at stair entry; discussed garage entry that is level and spouse in agreement with using that entry way, but pt wanting to practice front door stair entry to safely enter/exit the home. Plan to progress ambulation without KI pending quad strength and knee buckling, also possible stair training for front door entry prior to d/c home with spouse to assist while recovering.   Recommendations for follow up therapy are one component of a multi-disciplinary discharge planning process, led by the attending physician.  Recommendations may be updated based on patient status, additional functional criteria and insurance authorization.  Follow Up Recommendations  Follow physician's recommendations for discharge plan and follow up therapies     Assistance Recommended at Discharge Frequent or constant Supervision/Assistance  Patient can return home with the following A little help with walking and/or transfers;A little help with bathing/dressing/bathroom;Assistance with cooking/housework;Assist for transportation;Help with stairs or ramp for entrance   Equipment Recommendations  None recommended by PT    Recommendations for Other Services       Precautions / Restrictions Precautions Precautions: Fall;Knee Required Braces or Orthoses: Knee Immobilizer - Right Restrictions Weight Bearing  Restrictions: No     Mobility  Bed Mobility  General bed mobility comments: in recliner upon arrival    Transfers Overall transfer level: Needs assistance Equipment used: Rolling walker (2 wheels) Transfers: Sit to/from Stand Sit to Stand: Min assist  General transfer comment: min A to steady with rising to stand, cues for hand placement    Ambulation/Gait Ambulation/Gait assistance: Min guard Gait Distance (Feet): 140 Feet Assistive device: Rolling walker (2 wheels) Gait Pattern/deviations: Step-to pattern, Decreased stride length, Decreased stance time - right, Decreased weight shift to right Gait velocity: decreased  General Gait Details: step to pattern progressing to step through, 2 minor R knee buckling instances but with KI on and min guard to recover and continue ambulation and cued pt to return to step to for added stability, verbal cues for quad activation in standing   Stairs             Wheelchair Mobility    Modified Rankin (Stroke Patients Only)       Balance Overall balance assessment: Needs assistance, History of Falls Sitting-balance support: Feet supported Sitting balance-Leahy Scale: Good  Standing balance support: During functional activity, Bilateral upper extremity supported, Reliant on assistive device for balance Standing balance-Leahy Scale: Poor     Cognition Arousal/Alertness: Awake/alert Behavior During Therapy: WFL for tasks assessed/performed Overall Cognitive Status: Within Functional Limits for tasks assessed     Exercises Total Joint Exercises Ankle Circles/Pumps: Supine, AROM, Both, 10 reps Quad Sets: Seated, AROM, Strengthening, Both, 10 reps Straight Leg Raises: Seated, AROM, Strengthening, Right, 10 reps    General Comments        Pertinent Vitals/Pain Pain Assessment Pain Assessment: Faces Pain Score: 5  Faces Pain Scale: Hurts little more Pain Location: R knee with extension  Pain Descriptors / Indicators:  Grimacing, Guarding Pain Intervention(s): Limited activity within patient's tolerance, Monitored during session, Repositioned, Ice applied    Home Living                          Prior Function            PT Goals (current goals can now be found in the care plan section) Acute Rehab PT Goals Patient Stated Goal: return home with family support PT Goal Formulation: With patient Time For Goal Achievement: 08/23/22 Potential to Achieve Goals: Good Progress towards PT goals: Progressing toward goals    Frequency    7X/week      PT Plan Current plan remains appropriate    Co-evaluation              AM-PAC PT "6 Clicks" Mobility   Outcome Measure  Help needed turning from your back to your side while in a flat bed without using bedrails?: A Little Help needed moving from lying on your back to sitting on the side of a flat bed without using bedrails?: A Little Help needed moving to and from a bed to a chair (including a wheelchair)?: A Little Help needed standing up from a chair using your arms (e.g., wheelchair or bedside chair)?: A Little Help needed to walk in hospital room?: A Little Help needed climbing 3-5 steps with a railing? : A Lot 6 Click Score: 17    End of Session Equipment Utilized During Treatment: Gait belt Activity Tolerance: Patient tolerated treatment well Patient left: in chair;with call bell/phone within reach;with chair alarm set;with family/visitor present Nurse Communication: Mobility status PT Visit Diagnosis: Unsteadiness on feet (R26.81);Other abnormalities of gait and mobility (R26.89);Muscle weakness (generalized) (M62.81);Pain Pain - Right/Left: Right Pain - part of body: Knee     Time: 7510-2585 PT Time Calculation (min) (ACUTE ONLY): 27 min  Charges:  $Gait Training: 8-22 mins $Therapeutic Exercise: 8-22 mins                      Tori Kerrie Timm PT, DPT 08/10/22, 3:13 PM

## 2022-08-10 NOTE — Progress Notes (Signed)
Physical Therapy Treatment Patient Details Name: Catherine Haas MRN: [id number] DOB: 01-20-1946 Today's Date: 08/10/2022   History of Present Illness Catherine Haas is a 76 y.o. female patient s/p R TKA 08/09/22. PMH: anxiety, depresion, HTN    PT Comments    POD 1, pt with knee buckling in standing so added R KI for added support. Pt ambulates with step to gait pattern, cued for R knee extension in stance to improve quad activation. Pt tolerates seated BLE strengthening exercises, quad activation noted, difficulty with SLR only rising ~2 inches but able to maintain with only ~10 deg static flexion. Continue to progress as able.   Recommendations for follow up therapy are one component of a multi-disciplinary discharge planning process, led by the attending physician.  Recommendations may be updated based on patient status, additional functional criteria and insurance authorization.  Follow Up Recommendations  Follow physician's recommendations for discharge plan and follow up therapies     Assistance Recommended at Discharge Frequent or constant Supervision/Assistance  Patient can return home with the following A little help with walking and/or transfers;A little help with bathing/dressing/bathroom;Assistance with cooking/housework;Assist for transportation;Help with stairs or ramp for entrance   Equipment Recommendations  None recommended by PT    Recommendations for Other Services       Precautions / Restrictions Precautions Precautions: Fall;Knee Required Braces or Orthoses: Knee Immobilizer - Right Restrictions Weight Bearing Restrictions: No     Mobility  Bed Mobility  General bed mobility comments: in recliner upon arrival    Transfers Overall transfer level: Needs assistance Equipment used: Rolling walker (2 wheels) Transfers: Sit to/from Stand Sit to Stand: Min assist  General transfer comment: min A to power up to standing, good BLE engagement, RLE slightly  extended for comfort, cues for hand placement    Ambulation/Gait Ambulation/Gait assistance: Min guard Gait Distance (Feet): 40 Feet Assistive device: Rolling walker (2 wheels) Gait Pattern/deviations: Step-to pattern, Decreased stride length, Decreased stance time - right, Decreased weight shift to right Gait velocity: decreased  General Gait Details: initial R knee buckling so applied KI for added stability with good results, pt able to ambulate with step to pattern, cues for R knee extension in stance to increased quad activation in standing, limited by fatigue   Stairs             Wheelchair Mobility    Modified Rankin (Stroke Patients Only)       Balance Overall balance assessment: Needs assistance, History of Falls Sitting-balance support: Feet supported Sitting balance-Leahy Scale: Good  Standing balance support: During functional activity, Bilateral upper extremity supported, Reliant on assistive device for balance Standing balance-Leahy Scale: Poor     Cognition Arousal/Alertness: Awake/alert Behavior During Therapy: WFL for tasks assessed/performed Overall Cognitive Status: Within Functional Limits for tasks assessed       Exercises Total Joint Exercises Ankle Circles/Pumps: Supine, AROM, Both, 10 reps Quad Sets: Seated, AROM, Strengthening, Both, 10 reps Straight Leg Raises: Seated, AROM, Strengthening, Right, 10 reps    General Comments        Pertinent Vitals/Pain Pain Assessment Pain Assessment: 0-10 Pain Score: 5  Pain Location: R knee with extension Pain Descriptors / Indicators: Aching, Sore Pain Intervention(s): Limited activity within patient's tolerance, Monitored during session, Premedicated before session, Repositioned    Home Living                          Prior Function  PT Goals (current goals can now be found in the care plan section) Acute Rehab PT Goals Patient Stated Goal: return home with family  support PT Goal Formulation: With patient Time For Goal Achievement: 08/23/22 Potential to Achieve Goals: Good Progress towards PT goals: Progressing toward goals    Frequency    7X/week      PT Plan Current plan remains appropriate    Co-evaluation              AM-PAC PT "6 Clicks" Mobility   Outcome Measure  Help needed turning from your back to your side while in a flat bed without using bedrails?: A Little Help needed moving from lying on your back to sitting on the side of a flat bed without using bedrails?: A Little Help needed moving to and from a bed to a chair (including a wheelchair)?: A Little Help needed standing up from a chair using your arms (e.g., wheelchair or bedside chair)?: A Little Help needed to walk in hospital room?: A Little Help needed climbing 3-5 steps with a railing? : A Lot 6 Click Score: 17    End of Session Equipment Utilized During Treatment: Gait belt Activity Tolerance: Patient tolerated treatment well Patient left: in chair;with call bell/phone within reach;with chair alarm set Nurse Communication: Mobility status;Other (comment) (KI) PT Visit Diagnosis: Unsteadiness on feet (R26.81);Other abnormalities of gait and mobility (R26.89);Muscle weakness (generalized) (M62.81);Pain Pain - Right/Left: Right Pain - part of body: Knee     Time: 7371-0626 PT Time Calculation (min) (ACUTE ONLY): 34 min  Charges:  $Gait Training: 8-22 mins $Therapeutic Exercise: 8-22 mins                      Tori Blanchard [place] PT, DPT 08/10/22, 11:29 AM

## 2022-08-10 NOTE — Plan of Care (Signed)
  Problem: Education: Goal: Knowledge of General Education information will improve Description Including pain rating scale, medication(s)/side effects and non-pharmacologic comfort measures Outcome: Progressing   

## 2022-08-10 NOTE — Progress Notes (Signed)
Orthopedic Tech Progress Note Patient Details:  [name] 1946/02/14 [id number]  Patient ID: Catherine Haas, female   DOB: November 13, 1945, 76 y.o.   MRN: [id number]  [name] 08/10/2022, 9:54 AM Knee immobilizer delivered to room per verbal from PT

## 2022-08-11 DIAGNOSIS — M1711 Unilateral primary osteoarthritis, right knee: Secondary | ICD-10-CM | POA: Diagnosis not present

## 2022-08-11 LAB — CBC
HCT: 28.3 % — ABNORMAL LOW (ref 36.0–46.0)
Hemoglobin: 8.9 g/dL — ABNORMAL LOW (ref 12.0–15.0)
MCH: 32.6 pg (ref 26.0–34.0)
MCHC: 31.4 g/dL (ref 30.0–36.0)
MCV: 103.7 fL — ABNORMAL HIGH (ref 80.0–100.0)
Platelets: 159 10*3/uL (ref 150–400)
RBC: 2.73 MIL/uL — ABNORMAL LOW (ref 3.87–5.11)
RDW: 13.1 % (ref 11.5–15.5)
WBC: 7.7 10*3/uL (ref 4.0–10.5)
nRBC: 0 % (ref 0.0–0.2)
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
## 2022-08-11 NOTE — Progress Notes (Signed)
Pt has passed PT and is ready for discharge.  TED stockings applied this am.  Pt remains alert and oriented.  Husband at bedside.  AVS given.  Pt able to verbalize understanding of all discharge instructions, including wound care.  All questions answered.

## 2022-08-11 NOTE — Plan of Care (Signed)
  Problem: Education: Goal: Knowledge of the prescribed therapeutic regimen will improve Outcome: Progressing Goal: Individualized Educational Video(s) Outcome: Progressing   Problem: Activity: Goal: Ability to avoid complications of mobility impairment will improve Outcome: Progressing Goal: Range of joint motion will improve Outcome: Progressing   Problem: Clinical Measurements: Goal: Postoperative complications will be avoided or minimized Outcome: Progressing   Problem: Pain Management: Goal: Pain level will decrease with appropriate interventions Outcome: Progressing   Problem: Skin Integrity: Goal: Will show signs of wound healing Outcome: Progressing   Problem: Education: Goal: Knowledge of General Education information will improve Description: Including pain rating scale, medication(s)/side effects and non-pharmacologic comfort measures Outcome: Progressing   Problem: Health Behavior/Discharge Planning: Goal: Ability to manage health-related needs will improve Outcome: Progressing   Problem: Clinical Measurements: Goal: Ability to maintain clinical measurements within normal limits will improve Outcome: Progressing Goal: Will remain free from infection Outcome: Progressing Goal: Diagnostic test results will improve Outcome: Progressing Goal: Respiratory complications will improve Outcome: Progressing Goal: Cardiovascular complication will be avoided Outcome: Progressing   Problem: Activity: Goal: Risk for activity intolerance will decrease Outcome: Progressing   Problem: Elimination: Goal: Will not experience complications related to bowel motility Outcome: Progressing Goal: Will not experience complications related to urinary retention Outcome: Progressing   Problem: Pain Managment: Goal: General experience of comfort will improve Outcome: Progressing   Problem: Safety: Goal: Ability to remain free from injury will improve Outcome: Progressing    Problem: Skin Integrity: Goal: Risk for impaired skin integrity will decrease Outcome: Progressing   

## 2022-08-11 NOTE — Progress Notes (Signed)
Physical Therapy Treatment Patient Details Name: Catherine Haas MRN: [id number] DOB: Mar 21, 1946 Today's Date: 08/11/2022   History of Present Illness Catherine Haas is a 76 y.o. female patient s/p R TKA 08/09/22. PMH: anxiety, depresion, HTN    PT Comments    Pt very cooperative and up to ambulate in hall, negotiated stairs, reviewed don/doff KI and performed HEP with written instruction provided and reviewed.  Spouse present and attentive for full session.   Recommendations for follow up therapy are one component of a multi-disciplinary discharge planning process, led by the attending physician.  Recommendations may be updated based on patient status, additional functional criteria and insurance authorization.  Follow Up Recommendations  Follow physician's recommendations for discharge plan and follow up therapies     Assistance Recommended at Discharge Frequent or constant Supervision/Assistance  Patient can return home with the following A little help with walking and/or transfers;A little help with bathing/dressing/bathroom;Assistance with cooking/housework;Assist for transportation;Help with stairs or ramp for entrance   Equipment Recommendations  None recommended by PT    Recommendations for Other Services       Precautions / Restrictions Precautions Precautions: Fall;Knee Required Braces or Orthoses: Knee Immobilizer - Right Knee Immobilizer - Right: Discontinue once straight leg raise with < 10 degree lag Restrictions Weight Bearing Restrictions: No Other Position/Activity Restrictions: WBAT     Mobility  Bed Mobility Overal bed mobility: Needs Assistance Bed Mobility: Supine to Sit     Supine to sit: Min guard     General bed mobility comments: min guard for safety - pt is impulsive    Transfers Overall transfer level: Needs assistance Equipment used: Rolling walker (2 wheels) Transfers: Sit to/from Stand Sit to Stand: Min guard, Supervision            General transfer comment: cues for LE management and use of UEs to self assist    Ambulation/Gait Ambulation/Gait assistance: Min guard Gait Distance (Feet): 140 Feet Assistive device: Rolling walker (2 wheels) Gait Pattern/deviations: Step-to pattern, Decreased stride length, Decreased stance time - right, Decreased weight shift to right Gait velocity: decreased     General Gait Details: cues for posture, sequence, stride length and safety awareness   Stairs Stairs: Yes Stairs assistance: Min assist Stair Management: No rails, Step to pattern, Forwards, Backwards Number of Stairs: 3 General stair comments: single step twice fwd and once bkwd with RW and cues for sequence   Wheelchair Mobility    Modified Rankin (Stroke Patients Only)       Balance Overall balance assessment: Needs assistance, History of Falls Sitting-balance support: Feet supported Sitting balance-Leahy Scale: Good     Standing balance support: During functional activity, Bilateral upper extremity supported, Reliant on assistive device for balance Standing balance-Leahy Scale: Poor                              Cognition Arousal/Alertness: Awake/alert Behavior During Therapy: WFL for tasks assessed/performed Overall Cognitive Status: Within Functional Limits for tasks assessed                                          Exercises Total Joint Exercises Ankle Circles/Pumps: Supine, AROM, Both, 20 reps Quad Sets: Seated, AROM, Strengthening, Both, 10 reps Heel Slides: AAROM, Right, 15 reps, Supine Straight Leg Raises: Strengthening, Right, 10 reps, AAROM, Supine    General  Comments        Pertinent Vitals/Pain Pain Assessment Pain Assessment: 0-10 Pain Score: 3  Pain Location: R knee with activity Pain Descriptors / Indicators: Aching, Sore Pain Intervention(s): Limited activity within patient's tolerance, Monitored during session, Premedicated before session,  Ice applied    Home Living                          Prior Function            PT Goals (current goals can now be found in the care plan section) Acute Rehab PT Goals Patient Stated Goal: return home with family support PT Goal Formulation: With patient Time For Goal Achievement: 08/23/22 Potential to Achieve Goals: Good Progress towards PT goals: Progressing toward goals    Frequency    7X/week      PT Plan Current plan remains appropriate    Co-evaluation              AM-PAC PT "6 Clicks" Mobility   Outcome Measure  Help needed turning from your back to your side while in a flat bed without using bedrails?: A Little Help needed moving from lying on your back to sitting on the side of a flat bed without using bedrails?: A Little Help needed moving to and from a bed to a chair (including a wheelchair)?: A Little Help needed standing up from a chair using your arms (e.g., wheelchair or bedside chair)?: A Little Help needed to walk in hospital room?: A Little Help needed climbing 3-5 steps with a railing? : A Little 6 Click Score: 18    End of Session Equipment Utilized During Treatment: Gait belt Activity Tolerance: Patient tolerated treatment well Patient left: in chair;with call bell/phone within reach;with chair alarm set;with family/visitor present Nurse Communication: Mobility status PT Visit Diagnosis: Unsteadiness on feet (R26.81);Other abnormalities of gait and mobility (R26.89);Muscle weakness (generalized) (M62.81);Pain Pain - Right/Left: Right Pain - part of body: Knee     Time: 0912-0957 PT Time Calculation (min) (ACUTE ONLY): 45 min  Charges:  $Gait Training: 8-22 mins $Therapeutic Exercise: 8-22 mins $Therapeutic Activity: 8-22 mins                     Debe Coder PT Acute Rehabilitation Services Pager [phone] Office [phone]    Tresten Pantoja 08/11/2022, 1:29 PM
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
## 2022-08-17 ENCOUNTER — Ambulatory Visit: Payer: Medicare Other | Admitting: Psychiatry

## 2022-08-25 ENCOUNTER — Other Ambulatory Visit: Payer: Self-pay | Admitting: Psychiatry

## 2022-08-25 DIAGNOSIS — F411 Generalized anxiety disorder: Secondary | ICD-10-CM

## 2022-08-25 DIAGNOSIS — F5105 Insomnia due to other mental disorder: Secondary | ICD-10-CM

## 2022-09-28 ENCOUNTER — Encounter: Payer: Self-pay | Admitting: Psychiatry

## 2022-09-28 ENCOUNTER — Ambulatory Visit (INDEPENDENT_AMBULATORY_CARE_PROVIDER_SITE_OTHER): Payer: Medicare Other | Admitting: Psychiatry

## 2022-09-28 DIAGNOSIS — R7989 Other specified abnormal findings of blood chemistry: Secondary | ICD-10-CM

## 2022-09-28 DIAGNOSIS — F5105 Insomnia due to other mental disorder: Secondary | ICD-10-CM

## 2022-09-28 DIAGNOSIS — G3184 Mild cognitive impairment, so stated: Secondary | ICD-10-CM

## 2022-09-28 DIAGNOSIS — F411 Generalized anxiety disorder: Secondary | ICD-10-CM | POA: Diagnosis not present

## 2022-09-28 DIAGNOSIS — E538 Deficiency of other specified B group vitamins: Secondary | ICD-10-CM

## 2022-09-28 DIAGNOSIS — F331 Major depressive disorder, recurrent, moderate: Secondary | ICD-10-CM | POA: Diagnosis not present

## 2022-09-28 MED ORDER — VENLAFAXINE HCL ER 150 MG PO CP24: 150.0000 mg | ORAL_CAPSULE | Freq: Every day | ORAL | 3 refills | Status: DC

## 2022-09-28 MED ORDER — CLONAZEPAM 0.5 MG PO TABS: 0.5000 mg | ORAL_TABLET | Freq: Three times a day (TID) | ORAL | 3 refills | Status: DC | PRN
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
## 2022-12-27 ENCOUNTER — Telehealth: Payer: Self-pay | Admitting: Psychiatry

## 2022-12-27 NOTE — Telephone Encounter (Signed)
Pt LVM @ 10:54a.  She said she doesn't under the directions on her medicines and also when does she need to call in to get the refills.  In the past, she waited until she was out and then it took 3 wks to get the meds from [facility].  She is not out currently (she didn't advise which medicine), but she will be soon.  Also she said she will not be home from 2:30-3:30p today.

## 2022-12-27 NOTE — Telephone Encounter (Signed)
LVM to RC 

## 2022-12-27 NOTE — Telephone Encounter (Signed)
Next appt 6/11

## 2022-12-28 NOTE — Telephone Encounter (Signed)
Pt left message that she had gotten things straightened out and doesn't need anyone to call her back.

## 2023-01-22 ENCOUNTER — Other Ambulatory Visit: Payer: Self-pay | Admitting: Psychiatry

## 2023-01-25 ENCOUNTER — Other Ambulatory Visit: Payer: Self-pay | Admitting: Psychiatry

## 2023-02-06 ENCOUNTER — Telehealth: Payer: Self-pay | Admitting: Psychiatry

## 2023-02-06 ENCOUNTER — Other Ambulatory Visit: Payer: Self-pay

## 2023-02-06 DIAGNOSIS — F411 Generalized anxiety disorder: Secondary | ICD-10-CM

## 2023-02-06 DIAGNOSIS — F5105 Insomnia due to other mental disorder: Secondary | ICD-10-CM

## 2023-02-06 MED ORDER — CLONAZEPAM 0.5 MG PO TABS: 0.5000 mg | ORAL_TABLET | Freq: Three times a day (TID) | ORAL | 2 refills | Status: DC | PRN

## 2023-02-06 NOTE — Telephone Encounter (Signed)
Catherine Haas called to request a refill of her Clonazepam be sent to her local pharmacy [id number] - [place], [place] - [address] .  She has a refill at [facility] but they do not have the medication so she needs the local refill to tie her over until [facility] gets the medication.  They are not sure when they will get it.  Next appt 6/11.  Send at least a month supply.

## 2023-02-06 NOTE — Telephone Encounter (Signed)
Pended.

## 2023-04-04 ENCOUNTER — Ambulatory Visit (INDEPENDENT_AMBULATORY_CARE_PROVIDER_SITE_OTHER): Payer: Medicare Other | Admitting: Psychiatry

## 2023-04-04 ENCOUNTER — Encounter: Payer: Self-pay | Admitting: Psychiatry

## 2023-04-04 DIAGNOSIS — F331 Major depressive disorder, recurrent, moderate: Secondary | ICD-10-CM

## 2023-04-04 DIAGNOSIS — F5105 Insomnia due to other mental disorder: Secondary | ICD-10-CM | POA: Diagnosis not present

## 2023-04-04 DIAGNOSIS — F411 Generalized anxiety disorder: Secondary | ICD-10-CM | POA: Diagnosis not present

## 2023-04-04 DIAGNOSIS — R7989 Other specified abnormal findings of blood chemistry: Secondary | ICD-10-CM

## 2023-04-04 DIAGNOSIS — E538 Deficiency of other specified B group vitamins: Secondary | ICD-10-CM | POA: Diagnosis not present

## 2023-04-04 DIAGNOSIS — F3341 Major depressive disorder, recurrent, in partial remission: Secondary | ICD-10-CM | POA: Diagnosis not present

## 2023-04-04 MED ORDER — CLONAZEPAM 0.5 MG PO TABS: 0.5000 mg | ORAL_TABLET | Freq: Three times a day (TID) | ORAL | 5 refills | Status: DC | PRN
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
## 2023-04-11 ENCOUNTER — Other Ambulatory Visit: Payer: Self-pay | Admitting: Psychiatry

## 2023-04-25 ENCOUNTER — Telehealth: Payer: Self-pay | Admitting: Psychiatry

## 2023-04-25 ENCOUNTER — Other Ambulatory Visit: Payer: Self-pay

## 2023-04-25 DIAGNOSIS — F411 Generalized anxiety disorder: Secondary | ICD-10-CM

## 2023-04-25 DIAGNOSIS — F5105 Insomnia due to other mental disorder: Secondary | ICD-10-CM

## 2023-04-25 NOTE — Telephone Encounter (Signed)
Pended to [facility].

## 2023-04-25 NOTE — Telephone Encounter (Signed)
Patient lvm at 11:15 requesting refill for Clonazepam 0.5mg . States she has enough for this week and next week but there is a bit of a process due to pharmacy being mail order. Ph: [phone] Appt 3/11 Pharmacy [facility] [address] [place], [place]

## 2023-04-25 NOTE — Telephone Encounter (Signed)
[facility], [facility] to [facility] Will [facility] at [facility] and repend for [facility].

## 2023-04-26 MED ORDER — CLONAZEPAM 0.5 MG PO TABS: 0.5000 mg | ORAL_TABLET | Freq: Three times a day (TID) | ORAL | 5 refills | Status: DC | PRN

## 2023-06-06 ENCOUNTER — Other Ambulatory Visit: Payer: Self-pay

## 2023-06-06 ENCOUNTER — Telehealth: Payer: Self-pay | Admitting: Psychiatry

## 2023-06-06 DIAGNOSIS — F5105 Insomnia due to other mental disorder: Secondary | ICD-10-CM

## 2023-06-06 DIAGNOSIS — F411 Generalized anxiety disorder: Secondary | ICD-10-CM

## 2023-06-06 MED ORDER — CLONAZEPAM 0.5 MG PO TABS: 0.5000 mg | ORAL_TABLET | Freq: Three times a day (TID) | ORAL | 5 refills | Status: DC | PRN

## 2023-06-06 NOTE — Telephone Encounter (Signed)
Pt called and said that she wants [facility]  [facility] [address] in [place]. Please cancel at [facility]

## 2023-06-06 NOTE — Telephone Encounter (Signed)
Pended.

## 2023-10-23 ENCOUNTER — Other Ambulatory Visit: Payer: Self-pay | Admitting: Psychiatry

## 2023-10-23 DIAGNOSIS — F331 Major depressive disorder, recurrent, moderate: Secondary | ICD-10-CM

## 2023-10-23 DIAGNOSIS — F411 Generalized anxiety disorder: Secondary | ICD-10-CM

## 2023-12-15 ENCOUNTER — Other Ambulatory Visit: Payer: Self-pay | Admitting: Psychiatry

## 2023-12-15 DIAGNOSIS — F411 Generalized anxiety disorder: Secondary | ICD-10-CM

## 2023-12-15 DIAGNOSIS — F5105 Insomnia due to other mental disorder: Secondary | ICD-10-CM

## 2023-12-15 NOTE — Telephone Encounter (Signed)
LF 1/27 due 2/24

## 2023-12-28 ENCOUNTER — Other Ambulatory Visit: Payer: Self-pay | Admitting: Psychiatry

## 2024-01-01 ENCOUNTER — Other Ambulatory Visit: Payer: Self-pay | Admitting: Psychiatry

## 2024-01-01 DIAGNOSIS — F331 Major depressive disorder, recurrent, moderate: Secondary | ICD-10-CM

## 2024-01-01 DIAGNOSIS — F411 Generalized anxiety disorder: Secondary | ICD-10-CM

## 2024-01-02 ENCOUNTER — Encounter: Payer: Self-pay | Admitting: Psychiatry

## 2024-01-02 ENCOUNTER — Ambulatory Visit: Payer: Medicare Other | Admitting: Psychiatry

## 2024-01-02 DIAGNOSIS — R7989 Other specified abnormal findings of blood chemistry: Secondary | ICD-10-CM

## 2024-01-02 DIAGNOSIS — E538 Deficiency of other specified B group vitamins: Secondary | ICD-10-CM

## 2024-01-02 DIAGNOSIS — F411 Generalized anxiety disorder: Secondary | ICD-10-CM | POA: Diagnosis not present

## 2024-01-02 DIAGNOSIS — F3341 Major depressive disorder, recurrent, in partial remission: Secondary | ICD-10-CM

## 2024-01-02 DIAGNOSIS — F5105 Insomnia due to other mental disorder: Secondary | ICD-10-CM

## 2024-01-02 DIAGNOSIS — F331 Major depressive disorder, recurrent, moderate: Secondary | ICD-10-CM

## 2024-01-02 MED ORDER — CLONAZEPAM 0.5 MG PO TABS: 0.5000 mg | ORAL_TABLET | Freq: Three times a day (TID) | ORAL | 1 refills | Status: DC | PRN

## 2024-01-02 MED ORDER — VENLAFAXINE HCL ER 150 MG PO CP24: 150.0000 mg | ORAL_CAPSULE | Freq: Every day | ORAL | 3 refills | Status: DC
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
## 2024-01-16 ENCOUNTER — Other Ambulatory Visit: Payer: Self-pay | Admitting: Psychiatry

## 2024-01-16 DIAGNOSIS — F411 Generalized anxiety disorder: Secondary | ICD-10-CM

## 2024-01-16 DIAGNOSIS — F331 Major depressive disorder, recurrent, moderate: Secondary | ICD-10-CM

## 2024-02-07 ENCOUNTER — Other Ambulatory Visit: Payer: Self-pay

## 2024-02-07 ENCOUNTER — Telehealth: Payer: Self-pay

## 2024-02-07 DIAGNOSIS — F411 Generalized anxiety disorder: Secondary | ICD-10-CM

## 2024-02-07 DIAGNOSIS — F331 Major depressive disorder, recurrent, moderate: Secondary | ICD-10-CM

## 2024-02-07 MED ORDER — VENLAFAXINE HCL ER 150 MG PO CP24: 150.0000 mg | ORAL_CAPSULE | Freq: Every day | ORAL | 0 refills | Status: DC

## 2024-02-07 NOTE — Telephone Encounter (Signed)
 Pt came into the office today. She said her husband wants all her medications, except for clonazepam, sent to [facility]. She had a list of medications and providers. I told her that I could only send what Dr. Toi Foster prescribes and that she would need to contact all the other providers. Rx was sent to [facility] for venlafaxine 150 mg. It is too early to fill the clonazepam and she has a RF available. She asks that clonazepam go to [facility] in [place].

## 2024-03-13 ENCOUNTER — Other Ambulatory Visit: Payer: Self-pay | Admitting: Psychiatry

## 2024-03-13 DIAGNOSIS — F411 Generalized anxiety disorder: Secondary | ICD-10-CM

## 2024-03-13 DIAGNOSIS — F5105 Insomnia due to other mental disorder: Secondary | ICD-10-CM

## 2024-05-16 ENCOUNTER — Other Ambulatory Visit: Payer: Self-pay | Admitting: Psychiatry

## 2024-05-16 DIAGNOSIS — F5105 Insomnia due to other mental disorder: Secondary | ICD-10-CM

## 2024-05-16 DIAGNOSIS — F411 Generalized anxiety disorder: Secondary | ICD-10-CM

## 2024-05-25 ENCOUNTER — Other Ambulatory Visit: Payer: Self-pay | Admitting: Psychiatry

## 2024-05-25 DIAGNOSIS — F411 Generalized anxiety disorder: Secondary | ICD-10-CM

## 2024-05-25 DIAGNOSIS — F331 Major depressive disorder, recurrent, moderate: Secondary | ICD-10-CM

## 2024-10-03 ENCOUNTER — Ambulatory Visit: Admitting: Psychiatry

## 2024-10-13 ENCOUNTER — Other Ambulatory Visit: Payer: Self-pay | Admitting: Psychiatry

## 2024-10-13 DIAGNOSIS — F331 Major depressive disorder, recurrent, moderate: Secondary | ICD-10-CM

## 2024-10-13 DIAGNOSIS — F411 Generalized anxiety disorder: Secondary | ICD-10-CM

## 2024-11-13 ENCOUNTER — Ambulatory Visit: Admitting: Psychiatry

## 2024-11-14 ENCOUNTER — Encounter: Payer: Self-pay | Admitting: Psychiatry

## 2024-11-14 ENCOUNTER — Ambulatory Visit: Admitting: Psychiatry

## 2024-11-14 DIAGNOSIS — F411 Generalized anxiety disorder: Secondary | ICD-10-CM

## 2024-11-14 DIAGNOSIS — F331 Major depressive disorder, recurrent, moderate: Secondary | ICD-10-CM | POA: Diagnosis not present

## 2024-11-14 DIAGNOSIS — F5105 Insomnia due to other mental disorder: Secondary | ICD-10-CM | POA: Diagnosis not present

## 2024-11-14 MED ORDER — CLONAZEPAM 0.5 MG PO TABS: 0.5000 mg | ORAL_TABLET | Freq: Three times a day (TID) | ORAL | 5 refills | Status: AC | PRN

## 2024-11-14 MED ORDER — VENLAFAXINE HCL ER 150 MG PO CP24: 150.0000 mg | ORAL_CAPSULE | Freq: Every day | ORAL | 3 refills | Status: AC
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
## 2025-08-14 ENCOUNTER — Ambulatory Visit: Admitting: Psychiatry
